# Patient Record
Sex: Male | Born: 1960 | Hispanic: No | Marital: Married | Smoking: Former smoker
Health system: Southern US, Community
[De-identification: ages and names within clinical notes are randomized; demographics above are authoritative.]

## PROBLEM LIST (undated history)

## (undated) DIAGNOSIS — T7840XA Allergy, unspecified, initial encounter: Secondary | ICD-10-CM

## (undated) DIAGNOSIS — N189 Chronic kidney disease, unspecified: Secondary | ICD-10-CM

## (undated) DIAGNOSIS — I219 Acute myocardial infarction, unspecified: Secondary | ICD-10-CM

## (undated) DIAGNOSIS — I429 Cardiomyopathy, unspecified: Secondary | ICD-10-CM

## (undated) DIAGNOSIS — E876 Hypokalemia: Secondary | ICD-10-CM

## (undated) DIAGNOSIS — I10 Essential (primary) hypertension: Secondary | ICD-10-CM

## (undated) DIAGNOSIS — E785 Hyperlipidemia, unspecified: Secondary | ICD-10-CM

## (undated) DIAGNOSIS — E119 Type 2 diabetes mellitus without complications: Secondary | ICD-10-CM

## (undated) HISTORY — DX: Acute myocardial infarction, unspecified: I21.9

## (undated) HISTORY — DX: Type 2 diabetes mellitus without complications: E11.9

## (undated) HISTORY — DX: Allergy, unspecified, initial encounter: T78.40XA

## (undated) HISTORY — DX: Essential (primary) hypertension: I10

## (undated) HISTORY — DX: Hypokalemia: E87.6

## (undated) HISTORY — DX: Hyperlipidemia, unspecified: E78.5

## (undated) HISTORY — DX: Chronic kidney disease, unspecified: N18.9

## (undated) HISTORY — DX: Cardiomyopathy, unspecified: I42.9

---

## 2008-04-22 ENCOUNTER — Emergency Department (HOSPITAL_COMMUNITY): Admission: EM | Admit: 2008-04-22 | Discharge: 2008-04-22 | Payer: Self-pay | Admitting: Emergency Medicine

## 2008-04-23 ENCOUNTER — Emergency Department (HOSPITAL_COMMUNITY): Admission: EM | Admit: 2008-04-23 | Discharge: 2008-04-23 | Payer: Self-pay | Admitting: Emergency Medicine

## 2011-02-17 LAB — BASIC METABOLIC PANEL
BUN: 16 mg/dL (ref 6–23)
Calcium: 9.1 mg/dL (ref 8.4–10.5)
Creatinine, Ser: 1.41 mg/dL (ref 0.4–1.5)
GFR calc non Af Amer: 54 mL/min — ABNORMAL LOW (ref >60)
Glucose, Bld: 147 mg/dL — ABNORMAL HIGH (ref 70–99)
Potassium: 4.2 mEq/L (ref 3.5–5.1)

## 2011-02-17 LAB — URINALYSIS, ROUTINE W REFLEX MICROSCOPIC
Bilirubin Urine: NEGATIVE
Ketones, ur: NEGATIVE mg/dL
Leukocytes, UA: NEGATIVE
Nitrite: NEGATIVE
Nitrite: NEGATIVE
Protein, ur: NEGATIVE mg/dL
Specific Gravity, Urine: 1.011 (ref 1.005–1.030)
Urobilinogen, UA: 0.2 mg/dL (ref 0.0–1.0)
pH: 7 (ref 5.0–8.0)
pH: 7 (ref 5.0–8.0)

## 2011-02-17 LAB — CBC
HCT: 47.8 % (ref 39.0–52.0)
HCT: 49.8 % (ref 39.0–52.0)
Hemoglobin: 16.2 g/dL (ref 13.0–17.0)
Platelets: 197 10*3/uL (ref 150–400)
Platelets: 212 10*3/uL (ref 150–400)
RDW: 14.2 % (ref 11.5–15.5)
RDW: 14.3 % (ref 11.5–15.5)
WBC: 9.6 10*3/uL (ref 4.0–10.5)

## 2011-02-17 LAB — DIFFERENTIAL
Basophils Absolute: 0 10*3/uL (ref 0.0–0.1)
Basophils Absolute: 0.1 10*3/uL (ref 0.0–0.1)
Basophils Relative: 1 % (ref 0–1)
Eosinophils Absolute: 0.1 10*3/uL (ref 0.0–0.7)
Eosinophils Relative: 0 % (ref 0–5)
Eosinophils Relative: 1 % (ref 0–5)
Lymphocytes Relative: 7 % — ABNORMAL LOW (ref 12–46)
Monocytes Absolute: 0.4 10*3/uL (ref 0.1–1.0)
Monocytes Relative: 4 % (ref 3–12)
Neutrophils Relative %: 88 % — ABNORMAL HIGH (ref 43–77)

## 2011-02-17 LAB — COMPREHENSIVE METABOLIC PANEL
ALT: 64 U/L — ABNORMAL HIGH (ref 0–53)
AST: 41 U/L — ABNORMAL HIGH (ref 0–37)
Albumin: 3.9 g/dL (ref 3.5–5.2)
Alkaline Phosphatase: 77 U/L (ref 39–117)
Chloride: 109 mEq/L (ref 96–112)
Potassium: 3.7 mEq/L (ref 3.5–5.1)
Sodium: 141 mEq/L (ref 135–145)
Total Bilirubin: 1 mg/dL (ref 0.3–1.2)
Total Protein: 7.5 g/dL (ref 6.0–8.3)

## 2011-02-17 LAB — URINE MICROSCOPIC-ADD ON

## 2011-10-02 ENCOUNTER — Ambulatory Visit (INDEPENDENT_AMBULATORY_CARE_PROVIDER_SITE_OTHER): Payer: 59 | Admitting: Family Medicine

## 2011-10-02 VITALS — BP 155/100 | HR 68 | Temp 97.9°F | Resp 16 | Ht 63.0 in | Wt 190.0 lb

## 2011-10-02 DIAGNOSIS — I10 Essential (primary) hypertension: Secondary | ICD-10-CM

## 2011-10-02 MED ORDER — LISINOPRIL-HYDROCHLOROTHIAZIDE 10-12.5 MG PO TABS: 1.0000 | ORAL_TABLET | Freq: Every day | ORAL | Status: DC

## 2011-10-02 NOTE — Progress Notes (Signed)
  Subjective:    Patient ID: Nathan Buckley, male    DOB: 13-Apr-1961, 51 y.o.   MRN: [id number]  HPI 51 yo male here with high blood pressure, headache, and dizziness. Never seen here before. 2 days ago had headache and dizziness.  Feels better now.  Decided to take BP when at [employer] yesterday because still felt a little "off".  Says someone there told him his BP was too high and he should see someone but can't say what it was (language barrier).  Hasn't been to a doctor in a couple years.  No chest pain or shortness of breath.    Review of Systems Negative except as per HPI     Objective:   Physical Exam  Constitutional: He appears well-developed and well-nourished.  Cardiovascular: Normal rate, regular rhythm, normal heart sounds and intact distal pulses.   No murmur heard. Pulmonary/Chest: Effort normal and breath sounds normal.  Neurological: He is alert.  Skin: Skin is warm and dry.   EKG: NSR.  Normal St-Twaves. Normal intervals.        Assessment & Plan:  HTN - BMET pending Start lisinopril- HCT 10-12.5.  F/U in 1 week.

## 2011-10-03 LAB — BASIC METABOLIC PANEL
BUN: 17 mg/dL (ref 6–23)
Creat: 1.09 mg/dL (ref 0.50–1.35)
Potassium: 4.4 mEq/L (ref 3.5–5.3)

## 2012-05-15 HISTORY — PX: COLONOSCOPY: SHX174

## 2012-10-10 ENCOUNTER — Ambulatory Visit: Payer: PRIVATE HEALTH INSURANCE

## 2012-10-10 ENCOUNTER — Ambulatory Visit (INDEPENDENT_AMBULATORY_CARE_PROVIDER_SITE_OTHER): Payer: PRIVATE HEALTH INSURANCE | Admitting: Internal Medicine

## 2012-10-10 VITALS — BP 124/76 | HR 60 | Temp 98.0°F | Resp 16 | Ht 63.0 in | Wt 189.0 lb

## 2012-10-10 DIAGNOSIS — R079 Chest pain, unspecified: Secondary | ICD-10-CM

## 2012-10-10 DIAGNOSIS — R0602 Shortness of breath: Secondary | ICD-10-CM

## 2012-10-10 DIAGNOSIS — J209 Acute bronchitis, unspecified: Secondary | ICD-10-CM

## 2012-10-10 MED ORDER — HYDROCODONE-ACETAMINOPHEN 7.5-325 MG/15ML PO SOLN: 5.0000 mL | Freq: Four times a day (QID) | ORAL | Status: DC | PRN

## 2012-10-10 MED ORDER — AZITHROMYCIN 500 MG PO TABS: 500.0000 mg | ORAL_TABLET | Freq: Every day | ORAL | Status: DC

## 2012-10-10 NOTE — Progress Notes (Signed)
  Subjective:    Patient ID: Nathan Buckley, male    DOB: [birth year], 52 y.o.   MRN: [id number]  HPI 3 week hx of chest pain, hurts to breath and this causes him to feel sob.No cough or hemoptysis. No fever. Pain has been assoc with eating certain foods. Does not smoke. Has stopped taking his lisinopril. No regular MD. Overweight.  Further hx reveals had fever and bad cough with bloody sputum 3 weeks ago! Language confusion.   Review of Systems     Objective:   Physical Exam  Vitals reviewed. Constitutional: He is oriented to person, place, and time. He appears well-developed and well-nourished. No distress.  HENT:  Nose: Nose normal.  Eyes: EOM are normal.  Neck: Normal range of motion. Neck supple.  Cardiovascular: Normal rate, regular rhythm and normal heart sounds.   Pulmonary/Chest: Effort normal.  Neurological: He is alert and oriented to person, place, and time.  Skin: Skin is warm and dry.  Psychiatric: His speech is normal and behavior is normal. Judgment and thought content normal. His mood appears anxious. Cognition and memory are normal.    EKG normal [facility] reading (PRIMARY) by  Dr Perrin Maltese possible round density/mass posterior on lateral./bronchitic changes.       Assessment & Plan:  Chest Pain/Cause unclear/Probable bronchitis Will tx and F/up Language barrier

## 2012-10-10 NOTE — Patient Instructions (Signed)

## 2012-10-15 ENCOUNTER — Ambulatory Visit (INDEPENDENT_AMBULATORY_CARE_PROVIDER_SITE_OTHER): Payer: PRIVATE HEALTH INSURANCE | Admitting: Family Medicine

## 2012-10-15 VITALS — BP 152/100 | HR 65 | Temp 98.5°F | Resp 16 | Ht 63.0 in | Wt 189.0 lb

## 2012-10-15 DIAGNOSIS — I10 Essential (primary) hypertension: Secondary | ICD-10-CM

## 2012-10-15 DIAGNOSIS — Z79899 Other long term (current) drug therapy: Secondary | ICD-10-CM

## 2012-10-15 DIAGNOSIS — E669 Obesity, unspecified: Secondary | ICD-10-CM

## 2012-10-15 DIAGNOSIS — K219 Gastro-esophageal reflux disease without esophagitis: Secondary | ICD-10-CM

## 2012-10-15 LAB — LIPID PANEL
HDL: 30 mg/dL — ABNORMAL LOW (ref >39)
LDL Cholesterol: 73 mg/dL (ref 0–99)

## 2012-10-15 LAB — COMPREHENSIVE METABOLIC PANEL
ALT: 36 U/L (ref 0–53)
AST: 42 U/L — ABNORMAL HIGH (ref 0–37)
Albumin: 4.3 g/dL (ref 3.5–5.2)
Alkaline Phosphatase: 88 U/L (ref 39–117)
CO2: 27 mEq/L (ref 19–32)
Calcium: 9 mg/dL (ref 8.4–10.5)
Creat: 1.19 mg/dL (ref 0.50–1.35)
Glucose, Bld: 96 mg/dL (ref 70–99)

## 2012-10-15 LAB — POCT CBC
HCT, POC: 47.4 % (ref 43.5–53.7)
Hemoglobin: 14.9 g/dL (ref 14.1–18.1)
Lymph, poc: 2.2 (ref 0.6–3.4)
MCH, POC: 24.7 pg — AB (ref 27–31.2)
MCHC: 31.4 g/dL — AB (ref 31.8–35.4)
WBC: 8.5 10*3/uL (ref 4.6–10.2)

## 2012-10-15 LAB — TSH: TSH: 1.321 u[IU]/mL (ref 0.350–4.500)

## 2012-10-15 MED ORDER — OMEPRAZOLE 40 MG PO CPDR: 40.0000 mg | DELAYED_RELEASE_CAPSULE | Freq: Every day | ORAL | Status: DC

## 2012-10-15 MED ORDER — LISINOPRIL-HYDROCHLOROTHIAZIDE 10-12.5 MG PO TABS: 1.0000 | ORAL_TABLET | Freq: Every day | ORAL | Status: DC

## 2012-10-15 NOTE — Patient Instructions (Addendum)
DASH Diet  The DASH diet stands for "Dietary Approaches to Stop Hypertension." It is a healthy eating plan that has been shown to reduce high blood pressure (hypertension) in as little as 14 days, while also possibly providing other significant health benefits. These other health benefits include reducing the risk of breast cancer after menopause and reducing the risk of type 2 diabetes, heart disease, colon cancer, and stroke. Health benefits also include weight loss and slowing kidney failure in patients with chronic kidney disease.   DIET GUIDELINES  · Limit salt (sodium). Your diet should contain less than 1500 mg of sodium daily.  · Limit refined or processed carbohydrates. Your diet should include mostly whole grains. Desserts and added sugars should be used sparingly.  · Include small amounts of heart-healthy fats. These types of fats include nuts, oils, and tub margarine. Limit saturated and trans fats. These fats have been shown to be harmful in the body.  CHOOSING FOODS   The following food groups are based on a 2000 calorie diet. See your Registered Dietitian for individual calorie needs.  Grains and Grain Products (6 to 8 servings daily)  · Eat More Often: Whole-wheat bread, brown rice, whole-grain or wheat pasta, quinoa, popcorn without added fat or salt (air popped).  · Eat Less Often: White bread, white pasta, white rice, cornbread.  Vegetables (4 to 5 servings daily)  · Eat More Often: Fresh, frozen, and canned vegetables. Vegetables may be raw, steamed, roasted, or grilled with a minimal amount of fat.  · Eat Less Often/Avoid: Creamed or fried vegetables. Vegetables in a cheese sauce.  Fruit (4 to 5 servings daily)  · Eat More Often: All fresh, canned (in natural juice), or frozen fruits. Dried fruits without added sugar. One hundred percent fruit juice (½ cup [237 mL] daily).  · Eat Less Often: Dried fruits with added sugar. Canned fruit in light or heavy syrup.  Lean Meats, Fish, and Poultry (2  servings or less daily. One serving is 3 to 4 oz [85-114 g]).  · Eat More Often: Ninety percent or leaner ground beef, tenderloin, sirloin. Round cuts of beef, chicken breast, turkey breast. All fish. Grill, bake, or broil your meat. Nothing should be fried.  · Eat Less Often/Avoid: Fatty cuts of meat, turkey, or chicken leg, thigh, or wing. Fried cuts of meat or fish.  Dairy (2 to 3 servings)  · Eat More Often: Low-fat or fat-free milk, low-fat plain or light yogurt, reduced-fat or part-skim cheese.  · Eat Less Often/Avoid: Milk (whole, 2%). Whole milk yogurt. Full-fat cheeses.  Nuts, Seeds, and Legumes (4 to 5 servings per week)  · Eat More Often: All without added salt.  · Eat Less Often/Avoid: Salted nuts and seeds, canned beans with added salt.  Fats and Sweets (limited)  · Eat More Often: Vegetable oils, tub margarines without trans fats, sugar-free gelatin. Mayonnaise and salad dressings.  · Eat Less Often/Avoid: Coconut oils, palm oils, butter, stick margarine, cream, half and half, cookies, candy, pie.  FOR MORE INFORMATION  The Dash Diet Eating Plan: www.dashdiet.org  Document Released: 04/20/2011 Document Revised: 07/24/2011 Document Reviewed: 04/20/2011  ExitCare® Patient Information ©2014 [employer].

## 2012-10-15 NOTE — Progress Notes (Signed)
  Subjective:    Patient ID: Nathan Buckley, male    DOB: Feb 05, 1961, 52 y.o.   MRN: [id number] Chief Complaint  Patient presents with  . Follow-up    chest pain now feels better  . Medication Refill    blood pressure medicine    HPI  Chest pain is still a present a little but much better - did hurt last night night before he went to work - he put menthol on chest and back which helped and is currently chest pain free.    Ran out of BP med last yr - checks BP at [employer] - always high about 190s!!  No prob with prior med.  No further cough or hemoptysis (which was very mild prior.)  Past Medical History  Diagnosis Date  . Allergy   . Hypertension    No current outpatient prescriptions on file prior to visit.   No current facility-administered medications on file prior to visit.   No Known Allergies   Review of Systems    BP 152/100  Pulse 65  Temp(Src) 98.5 F (36.9 C) (Oral)  Resp 16  Ht 5\' 3"  (1.6 m)  Wt 189 lb (85.73 kg)  BMI 33.49 kg/m2  SpO2 96% Objective:   Physical Exam        Assessment & Plan:  Hypertension - Plan: POCT CBC, Comprehensive metabolic panel, Lipid panel, TSH  GERD (gastroesophageal reflux disease) - Plan: H. pylori antibody, IgG  Encounter for long-term (current) use of other medications  Obesity (BMI 30.0-34.9)  Meds ordered this encounter  Medications  . lisinopril-hydrochlorothiazide (PRINZIDE,ZESTORETIC) 10-12.5 MG per tablet    Sig: Take 1 tablet by mouth daily.    Dispense:  90 tablet    Refill:  1  . omeprazole (PRILOSEC) 40 MG capsule    Sig: Take 1 capsule (40 mg total) by mouth daily.    Dispense:  30 capsule    Refill:  3

## 2012-10-16 LAB — H. PYLORI ANTIBODY, IGG: H Pylori IgG: 0.42 {ISR}

## 2012-10-31 ENCOUNTER — Telehealth: Payer: Self-pay

## 2012-10-31 NOTE — Telephone Encounter (Signed)
Did they get letter?

## 2012-10-31 NOTE — Telephone Encounter (Signed)
Pt's son called in regards to the unable to reach letter we sent for his labs. Pt's son notified and copy of labs sent to pt

## 2012-10-31 NOTE — Telephone Encounter (Signed)
Pt's son is calling in regards to pt's labs. Best# (Son/[place]) [phone]

## 2012-12-26 ENCOUNTER — Ambulatory Visit (INDEPENDENT_AMBULATORY_CARE_PROVIDER_SITE_OTHER): Payer: PRIVATE HEALTH INSURANCE | Admitting: Family Medicine

## 2012-12-26 VITALS — BP 116/76 | HR 65 | Temp 98.7°F | Resp 18 | Ht 63.0 in | Wt 184.0 lb

## 2012-12-26 DIAGNOSIS — R1013 Epigastric pain: Secondary | ICD-10-CM

## 2012-12-26 DIAGNOSIS — R079 Chest pain, unspecified: Secondary | ICD-10-CM

## 2012-12-26 DIAGNOSIS — R0602 Shortness of breath: Secondary | ICD-10-CM

## 2012-12-26 LAB — COMPREHENSIVE METABOLIC PANEL
ALT: 24 U/L (ref 0–53)
AST: 19 U/L (ref 0–37)
Albumin: 4.1 g/dL (ref 3.5–5.2)
Alkaline Phosphatase: 65 U/L (ref 39–117)
BUN: 20 mg/dL (ref 6–23)
Calcium: 9.4 mg/dL (ref 8.4–10.5)
Chloride: 105 mEq/L (ref 96–112)
Creat: 1.11 mg/dL (ref 0.50–1.35)
Potassium: 4.2 mEq/L (ref 3.5–5.3)

## 2012-12-26 LAB — POCT CBC
Granulocyte percent: 44.7 %G (ref 37–80)
MID (cbc): 0.6 (ref 0–0.9)
POC Granulocyte: 2.7 (ref 2–6.9)
POC LYMPH PERCENT: 45.7 %L (ref 10–50)
Platelet Count, POC: 203 10*3/uL (ref 142–424)
RBC: 5.88 M/uL (ref 4.69–6.13)
RDW, POC: 15.6 %

## 2012-12-26 MED ORDER — SUCRALFATE 1 GM/10ML PO SUSP: 1.0000 g | Freq: Three times a day (TID) | ORAL | Status: DC

## 2012-12-26 NOTE — Patient Instructions (Addendum)
1.  You have an appointment with [facility] on Friday, 12/27/12 at 2:20.  Address:  [address] ([employer] [address]).  [phone] 2.  Present to the emergency room for recurrent chest pain. 3.  Start Aspirin 81mg  one tablet daily.

## 2012-12-26 NOTE — Progress Notes (Signed)
Subjective:    Patient ID: Nathan Buckley, male    DOB: 1960-08-09, 52 y.o.   MRN: [id number]  HPI This 52 y.o. male presents for evaluation of epigastric pain, shortness of breath.  Onset of symptoms two months ago with worsening.   Patient reports worsening epigastric pain. He has pain that starts in his upper abdomen, radiates up towards chest and throat and sometimes into back. Pain is squeezing and is somewhat relieved with pressing down on upper abdomen with his hands for 15 minutes. Pain occurs when he doesn't eat as well as when he eats a normal or large meal, or with heavy lifting.  Will get sweat with severe epigastric pain.  Has lost weight in past few months.  Duration of pain usually lasts 15 minutes. Prescribed Omeprazole 40mg  daily two months ago for symptoms but symptoms have worsened.  Reports that he also gets epigastric pain that radiates into substernal region and into back with jogging/exercising for 5-10 minutes every day after work.  Epigastric and chest pain also causes shortness of breath while jogging.  Resting improves pain.    Review of Systems  Constitutional: Positive for diaphoresis and fatigue. Negative for fever and appetite change.  Respiratory: Positive for shortness of breath. Negative for cough, chest tightness and wheezing.   Cardiovascular: Negative for leg swelling.  Gastrointestinal: Positive for abdominal pain. Negative for vomiting, diarrhea, constipation, blood in stool and abdominal distention.  Genitourinary: Negative for hematuria and difficulty urinating.  Musculoskeletal: Negative for myalgias, joint swelling and arthralgias.  Allergic/Immunologic: Negative for environmental allergies and food allergies.  Neurological: Negative for dizziness, light-headedness and headaches.  Psychiatric/Behavioral: The patient is not nervous/anxious.    Past Medical History  Diagnosis Date  . Allergy   . Hypertension    History reviewed. No  pertinent past surgical history. No Known Allergies Current Outpatient Prescriptions on File Prior to Visit  Medication Sig Dispense Refill  . lisinopril-hydrochlorothiazide (PRINZIDE,ZESTORETIC) 10-12.5 MG per tablet Take 1 tablet by mouth daily.  90 tablet  1  . omeprazole (PRILOSEC) 40 MG capsule Take 1 capsule (40 mg total) by mouth daily.  30 capsule  3   No current facility-administered medications on file prior to visit.   History   Social History  . Marital Status: Married    Spouse Name: N/A    Number of Children: N/A  . Years of Education: N/A   Occupational History  . Not on file.   Social History Main Topics  . Smoking status: Former Games developer  . Smokeless tobacco: Not on file  . Alcohol Use: Not on file  . Drug Use: Not on file  . Sexual Activity: Not on file   Other Topics Concern  . Not on file   Social History Narrative  . No narrative on file   History reviewed. No pertinent family history.    Objective:   Physical Exam  Constitutional: He is oriented to person, place, and time. He appears well-developed and well-nourished. No distress.  HENT:  Head: Normocephalic and atraumatic.  Mouth/Throat: Oropharynx is clear and moist.  Eyes: Conjunctivae and EOM are normal. Pupils are equal, round, and reactive to light.  Neck: Normal range of motion. Neck supple. No JVD present. No thyromegaly present.  Cardiovascular: Normal rate, regular rhythm, normal heart sounds and intact distal pulses.  Exam reveals no gallop and no friction rub.   No murmur heard. Pulmonary/Chest: Effort normal and breath sounds normal. No respiratory distress. He has no wheezes. He has  no rales.  Abdominal: Soft. Bowel sounds are normal. He exhibits no distension and no mass. There is no hepatosplenomegaly. There is tenderness in the epigastric area. There is no rebound, no guarding, no CVA tenderness, no tenderness at McBurney's point and negative Murphy's sign. No hernia.  Bowel sounds  slightly decreased throughout. Slight, diffuse tenderness with deep palpation.  Musculoskeletal: He exhibits no edema.  Lymphadenopathy:    He has no cervical adenopathy.  Neurological: He is alert and oriented to person, place, and time. No cranial nerve deficit. He exhibits normal muscle tone. Coordination normal.  Skin: Skin is warm and dry. He is not diaphoretic.  Psychiatric: He has a normal mood and affect. His behavior is normal.   Results for orders placed in visit on 12/26/12  POCT CBC      Result Value Range   WBC 6.1  4.6 - 10.2 K/uL   Lymph, poc 2.8  0.6 - 3.4   POC LYMPH PERCENT 45.7  10 - 50 %L   MID (cbc) 0.6  0 - 0.9   POC MID % 9.6  0 - 12 %M   POC Granulocyte 2.7  2 - 6.9   Granulocyte percent 44.7  37 - 80 %G   RBC 5.88  4.69 - 6.13 M/uL   Hemoglobin 14.5  14.1 - 18.1 g/dL   HCT, POC 40.9  81.1 - 53.7 %   MCV 78.4 (*) 80 - 97 fL   MCH, POC 24.7 (*) 27 - 31.2 pg   MCHC 31.5 (*) 31.8 - 35.4 g/dL   RDW, POC 91.4     Platelet Count, POC 203  142 - 424 K/uL   MPV 9.6  0 - 99.8 fL   EKG:  NSR: new T wave inversion in inferior leads and lateral leads.    Assessment & Plan:  Abdominal pain, epigastric - Plan: Comprehensive metabolic panel, CBC with Differential  Shortness of breath  Chest pain - Plan: EKG 12-Lead  1- Chest pain:  New.  Exertional component and post-prandial component.  New EKG changes concerning for ischemia; advised to avoid exercise; start ASA 81mg  daily; also advised to present to ED if pain recurs.  Patient has appointment tomorrow, 8/15 at 2:20 with [facility].   2- Epigastric pain- worsening; exertional component and post-prandial compoent; increased Omeprazole 40 mg to BID; add Carafate qac and qhs. Refer to GI for EGD; H. Pylori negative at last visit. 3.  SOB:  New. Associated with chest pain and epigastric pain. CXR negative on 10/10/12.  Normal pulse oximetry and respiratory exam.  Concern for anginal equivalent.  Refer  to cardiology.  Meds ordered this encounter  Medications  . sucralfate (CARAFATE) 1 GM/10ML suspension    Sig: Take 10 mL (1 g total) by mouth 4 (four) times daily -  with meals and at bedtime.    Dispense:  420 mL    Refill:  0

## 2012-12-27 ENCOUNTER — Encounter (HOSPITAL_COMMUNITY): Payer: Self-pay | Admitting: Emergency Medicine

## 2012-12-27 ENCOUNTER — Emergency Department (HOSPITAL_COMMUNITY): Payer: PRIVATE HEALTH INSURANCE

## 2012-12-27 ENCOUNTER — Encounter: Payer: Self-pay | Admitting: Physician Assistant

## 2012-12-27 ENCOUNTER — Ambulatory Visit (INDEPENDENT_AMBULATORY_CARE_PROVIDER_SITE_OTHER): Payer: PRIVATE HEALTH INSURANCE | Admitting: Physician Assistant

## 2012-12-27 ENCOUNTER — Inpatient Hospital Stay (HOSPITAL_COMMUNITY): Payer: PRIVATE HEALTH INSURANCE

## 2012-12-27 ENCOUNTER — Inpatient Hospital Stay (HOSPITAL_COMMUNITY)
Admission: EM | Admit: 2012-12-27 | Discharge: 2013-01-05 | DRG: 234 | Disposition: A | Discharge status: Home / Self Care | Payer: PRIVATE HEALTH INSURANCE | Attending: Cardiothoracic Surgery | Admitting: Cardiothoracic Surgery

## 2012-12-27 ENCOUNTER — Encounter: Payer: Self-pay | Admitting: Internal Medicine

## 2012-12-27 ENCOUNTER — Inpatient Hospital Stay (HOSPITAL_COMMUNITY)
Admission: AD | Admit: 2012-12-27 | Payer: PRIVATE HEALTH INSURANCE | Source: Ambulatory Visit | Admitting: Internal Medicine

## 2012-12-27 VITALS — BP 120/78 | HR 69 | Ht 63.0 in | Wt 184.0 lb

## 2012-12-27 DIAGNOSIS — I2510 Atherosclerotic heart disease of native coronary artery without angina pectoris: Principal | ICD-10-CM | POA: Diagnosis present

## 2012-12-27 DIAGNOSIS — R9431 Abnormal electrocardiogram [ECG] [EKG]: Secondary | ICD-10-CM | POA: Diagnosis present

## 2012-12-27 DIAGNOSIS — I200 Unstable angina: Secondary | ICD-10-CM | POA: Diagnosis present

## 2012-12-27 DIAGNOSIS — I10 Essential (primary) hypertension: Secondary | ICD-10-CM | POA: Insufficient documentation

## 2012-12-27 DIAGNOSIS — M549 Dorsalgia, unspecified: Secondary | ICD-10-CM | POA: Diagnosis present

## 2012-12-27 DIAGNOSIS — D62 Acute posthemorrhagic anemia: Secondary | ICD-10-CM | POA: Diagnosis not present

## 2012-12-27 DIAGNOSIS — Z951 Presence of aortocoronary bypass graft: Secondary | ICD-10-CM

## 2012-12-27 DIAGNOSIS — I059 Rheumatic mitral valve disease, unspecified: Secondary | ICD-10-CM | POA: Diagnosis present

## 2012-12-27 DIAGNOSIS — I340 Nonrheumatic mitral (valve) insufficiency: Secondary | ICD-10-CM | POA: Diagnosis present

## 2012-12-27 DIAGNOSIS — I341 Nonrheumatic mitral (valve) prolapse: Secondary | ICD-10-CM

## 2012-12-27 DIAGNOSIS — Z87891 Personal history of nicotine dependence: Secondary | ICD-10-CM

## 2012-12-27 LAB — COMPREHENSIVE METABOLIC PANEL
AST: 22 U/L (ref 0–37)
Albumin: 3.4 g/dL — ABNORMAL LOW (ref 3.5–5.2)
BUN: 19 mg/dL (ref 6–23)
Calcium: 9.5 mg/dL (ref 8.4–10.5)
Creatinine, Ser: 1.16 mg/dL (ref 0.50–1.35)
Total Bilirubin: 0.4 mg/dL (ref 0.3–1.2)

## 2012-12-27 LAB — CBC
MCH: 24.5 pg — ABNORMAL LOW (ref 26.0–34.0)
MCHC: 33.1 g/dL (ref 30.0–36.0)
Platelets: 187 10*3/uL (ref 150–400)
RDW: 14.3 % (ref 11.5–15.5)

## 2012-12-27 LAB — PROTIME-INR
INR: 0.92 (ref 0.00–1.49)
Prothrombin Time: 12.2 seconds (ref 11.6–15.2)

## 2012-12-27 LAB — POCT I-STAT TROPONIN I: Troponin i, poc: 0.03 ng/mL (ref 0.00–0.08)

## 2012-12-27 MED ORDER — METOPROLOL TARTRATE 12.5 MG HALF TABLET
12.5000 mg | ORAL_TABLET | Freq: Two times a day (BID) | ORAL | Status: DC
  Administered 2012-12-27 – 2012-12-30 (×6): 12.5 mg via ORAL
  Filled 2012-12-27 (×7): qty 1

## 2012-12-27 MED ORDER — ATORVASTATIN CALCIUM 10 MG PO TABS
10.0000 mg | ORAL_TABLET | Freq: Every day | ORAL | Status: DC
  Administered 2012-12-27 – 2013-01-04 (×7): 10 mg via ORAL
  Filled 2012-12-27 (×10): qty 1

## 2012-12-27 MED ORDER — ACETAMINOPHEN 325 MG PO TABS
650.0000 mg | ORAL_TABLET | ORAL | Status: DC | PRN
  Administered 2012-12-30 (×2): 650 mg via ORAL
  Filled 2012-12-27 (×2): qty 2

## 2012-12-27 MED ORDER — ASPIRIN EC 81 MG PO TBEC
81.0000 mg | DELAYED_RELEASE_TABLET | Freq: Every day | ORAL | Status: DC
  Administered 2012-12-28 – 2012-12-31 (×4): 81 mg via ORAL
  Filled 2012-12-27 (×5): qty 1

## 2012-12-27 MED ORDER — HEPARIN BOLUS VIA INFUSION
4000.0000 [IU] | Freq: Once | INTRAVENOUS | Status: AC
  Administered 2012-12-28: 4000 [IU] via INTRAVENOUS
  Filled 2012-12-27: qty 4000

## 2012-12-27 MED ORDER — NITROGLYCERIN IN D5W 200-5 MCG/ML-% IV SOLN
2.0000 ug/min | INTRAVENOUS | Status: DC
  Administered 2012-12-28: 5 ug/min via INTRAVENOUS
  Administered 2012-12-30: 20 ug/min via INTRAVENOUS
  Filled 2012-12-27 (×3): qty 250

## 2012-12-27 MED ORDER — ASPIRIN 300 MG RE SUPP
300.0000 mg | RECTAL | Status: AC
  Filled 2012-12-27: qty 1

## 2012-12-27 MED ORDER — ONDANSETRON HCL 4 MG/2ML IJ SOLN: 4.0000 mg | Freq: Four times a day (QID) | INTRAMUSCULAR | Status: DC | PRN

## 2012-12-27 MED ORDER — IOHEXOL 350 MG/ML SOLN
100.0000 mL | Freq: Once | INTRAVENOUS | Status: AC | PRN
  Administered 2012-12-27: 100 mL via INTRAVENOUS

## 2012-12-27 MED ORDER — HEPARIN (PORCINE) IN NACL 100-0.45 UNIT/ML-% IJ SOLN
900.0000 [IU]/h | INTRAMUSCULAR | Status: DC
  Administered 2012-12-28 (×2): 1200 [IU]/h via INTRAVENOUS
  Administered 2012-12-29: 1050 [IU]/h via INTRAVENOUS
  Filled 2012-12-27 (×4): qty 250

## 2012-12-27 MED ORDER — NITROGLYCERIN 0.4 MG SL SUBL: 0.4000 mg | SUBLINGUAL_TABLET | SUBLINGUAL | Status: DC | PRN

## 2012-12-27 MED ORDER — ASPIRIN 81 MG PO CHEW: 324.0000 mg | CHEWABLE_TABLET | ORAL | Status: AC

## 2012-12-27 NOTE — ED Notes (Signed)
Report has been call to floor. Pt going to CT before being taken to room upstairs.

## 2012-12-27 NOTE — H&P (Signed)
Nathan Buckley is an 52 y.o. male.   Chief Complaint:  Chest pain/back pain HPI: Patient is a 52 year old male originally from [place]. Past medical history includes allergies and hypertension. He has been experiencing chest/upper abdominal pain with SON and radiation to his back for approximately 2 months. He states it is worse with exertion both with ambulation and when lifting heavy objects. It eases off about 10-15 minutes of rest. At times it is worse after he eats.  He was seen by his primary care doctor yesterday who did an EKG and saw some T wave abnormalities.  He did start him on omeprazole as his pain as a GI component to it.  His EKG today shows a dramatic Q waves in the inferior leads along with T-wave inversion which is also in V3 through 6.  Past Medical History  Diagnosis Date  . Allergy   . Hypertension     No past surgical history on file.  ffamily history: Both parents are still living in their 71s no history of coronary disease.  Social History:  reports that he has quit smoking. He does not have any smokeless tobacco history on file. His alcohol and drug histories are not on file.  Allergies: No Known Allergies   (Not in a hospital admission)  Results for orders placed in visit on 12/26/12 (from the past 48 hour(s))  COMPREHENSIVE METABOLIC PANEL     Status: None   Collection Time    12/26/12  1:55 PM      Result Value Range   Sodium 137  135 - 145 mEq/L   Potassium 4.2  3.5 - 5.3 mEq/L   Chloride 105  96 - 112 mEq/L   CO2 24  19 - 32 mEq/L   Glucose, Bld 75  70 - 99 mg/dL   BUN 20  6 - 23 mg/dL   Creat 1.61  0.96 - 0.45 mg/dL   Total Bilirubin 0.5  0.3 - 1.2 mg/dL   Alkaline Phosphatase 65  39 - 117 U/L   AST 19  0 - 37 U/L   ALT 24  0 - 53 U/L   Total Protein 7.6  6.0 - 8.3 g/dL   Albumin 4.1  3.5 - 5.2 g/dL   Calcium 9.4  8.4 - 40.9 mg/dL  POCT CBC     Status: Abnormal   Collection Time    12/26/12  1:58 PM      Result Value Range   WBC  6.1  4.6 - 10.2 K/uL   Lymph, poc 2.8  0.6 - 3.4   POC LYMPH PERCENT 45.7  10 - 50 %L   MID (cbc) 0.6  0 - 0.9   POC MID % 9.6  0 - 12 %M   POC Granulocyte 2.7  2 - 6.9   Granulocyte percent 44.7  37 - 80 %G   RBC 5.88  4.69 - 6.13 M/uL   Hemoglobin 14.5  14.1 - 18.1 g/dL   HCT, POC 81.1  91.4 - 53.7 %   MCV 78.4 (*) 80 - 97 fL   MCH, POC 24.7 (*) 27 - 31.2 pg   MCHC 31.5 (*) 31.8 - 35.4 g/dL   RDW, POC 78.2     Platelet Count, POC 203  142 - 424 K/uL   MPV 9.6  0 - 99.8 fL   No results found.  Review of Systems  Constitutional: Negative for fever and diaphoresis.  HENT: Negative for congestion and sore throat.  Respiratory: Positive for shortness of breath. Negative for cough.   Cardiovascular: Positive for chest pain. Negative for orthopnea, leg swelling and PND.  Gastrointestinal: Negative for nausea, vomiting, blood in stool and melena.  Genitourinary: Negative for hematuria.  Musculoskeletal: Positive for back pain.  Neurological: Negative for dizziness.  All other systems reviewed and are negative.    Blood pressure 120/78, pulse 69, height 5\' 3"  (1.6 m), weight 184 lb (83.462 kg). Physical Exam  Vitals reviewed. Constitutional: He is oriented to person, place, and time. He appears well-developed and well-nourished. No distress.  HENT:  Head: Normocephalic and atraumatic.  Eyes: EOM are normal. Pupils are equal, round, and reactive to light. No scleral icterus.  Neck: Normal range of motion. Neck supple. No JVD present.  Cardiovascular: Normal rate, regular rhythm, S1 normal and S2 normal.   Murmur heard.  Systolic murmur is present with a grade of 1/6  Pulses:      Radial pulses are 2+ on the right side, and 2+ on the left side.       Posterior tibial pulses are 2+ on the right side, and 2+ on the left side.  No Carotid bruits. + epigastric/abd bruit  Respiratory: Effort normal and breath sounds normal. He has no wheezes. He has no rales.  GI: Soft. Bowel  sounds are normal. There is tenderness (epigastric).  Musculoskeletal: He exhibits no edema.  Lymphadenopathy:    He has no cervical adenopathy.  Neurological: He is alert and oriented to person, place, and time. He exhibits normal muscle tone.  Skin: Skin is warm and dry.  Psychiatric: He has a normal mood and affect.     Assessment/Plan 1. Unstable angina 2.Hypertension  Patient be admitted to step down unit at [facility]. He was taken for a CT angiogram of the aorta both chest and abdomen to rule out dissection. That is negative he'll be started on IV heparin and nitroglycerin.  He'll need left heart catheterization on Monday. Labs: Troponin x3 every 6 hours, compressive metabolic panel, CBC, lipid panel, PT/INR. We'll Lopressor 12.5 mg twice daily. We'll hold lisinopril. Start Lipitor.   Orval Dortch 12/27/2012, 4:10 PM

## 2012-12-27 NOTE — Progress Notes (Deleted)
    Date:  12/27/2012   ID:  Nathan Buckley, DOB [birth year], MRN [id number]  PCP:  No PCP Per Patient  Primary Cardiologist:  ***     History of Present Illness: Nathan Buckley is a 52 y.o. male ***  The patient currently denies nausea, vomiting, fever, chest pain, shortness of breath, orthopnea, dizziness, PND, cough, congestion, abdominal pain, hematochezia, melena, lower extremity edema, claudication.  Wt Readings from Last 3 Encounters:  12/27/12 184 lb (83.462 kg)  12/26/12 184 lb (83.462 kg)  10/15/12 189 lb (85.73 kg)     Past Medical History  Diagnosis Date  . Allergy   . Hypertension     Current Outpatient Prescriptions  Medication Sig Dispense Refill  . aspirin 81 MG tablet Take 81 mg by mouth daily.      Marland Kitchen lisinopril-hydrochlorothiazide (PRINZIDE,ZESTORETIC) 10-12.5 MG per tablet Take 1 tablet by mouth daily.  90 tablet  1  . omeprazole (PRILOSEC) 40 MG capsule Take 1 capsule (40 mg total) by mouth daily.  30 capsule  3  . sucralfate (CARAFATE) 1 GM/10ML suspension Take 10 mL (1 g total) by mouth 4 (four) times daily -  with meals and at bedtime.  420 mL  0   No current facility-administered medications for this visit.    Allergies:   No Known Allergies  Social History:  The patient  reports that he has quit smoking. He does not have any smokeless tobacco history on file.   Family history:  No family history on file.  ROS:  Please see the history of present illness.  All other systems reviewed and negative.   PHYSICAL EXAM: VS:  BP 120/78  Pulse 69  Ht 5\' 3"  (1.6 m)  Wt 184 lb (83.462 kg)  BMI 32.6 kg/m2 Well nourished, well developed, in no acute distress HEENT: Pupils are equal round react to light accommodation extraocular movements are intact.  Neck: no JVDNo cervical lymphadenopathy. Cardiac: Regular rate and rhythm without murmurs rubs or gallops. Lungs:  clear to auscultation bilaterally, no wheezing, rhonchi or  rales Abd: soft, nontender, positive bowel sounds all quadrants, no hepatosplenomegaly Ext: no lower extremity edema.  2+ radial and dorsalis pedis pulses. Skin: warm and dry Neuro:  Grossly normal  EKG:    ASSESSMENT AND PLAN:  Problem List Items Addressed This Visit   None

## 2012-12-27 NOTE — ED Notes (Signed)
Pt states CP is worse with ambulation. States "what I usually do is place pressure under my rib cage with my hands for five minutes, which relieves it. But today, it just isn't working." Reports pain at 2/10.

## 2012-12-27 NOTE — Progress Notes (Signed)
ANTICOAGULATION CONSULT NOTE - Initial Consult  Pharmacy Consult for heparin Indication: chest pain/ACS  No Known Allergies  Patient Measurements:    Dosing Weight: 83.5 kg   Vital Signs: Temp: 98.5 F (36.9 C) (08/15 1614) Temp src: Oral (08/15 1614) BP: 126/71 mmHg (08/15 2000) Pulse Rate: 68 (08/15 2000)  Labs:  Recent Labs  12/26/12 1355 12/26/12 1358 12/27/12 1647 12/27/12 1720  HGB  --  14.5 14.1  --   HCT  --  46.1 42.6  --   PLT  --   --  187  --   LABPROT  --   --   --  12.2  INR  --   --   --  0.92  CREATININE 1.11  --  1.16  --     The CrCl is unknown because both a height and weight (above a minimum accepted value) are required for this calculation.   Medical History: Past Medical History  Diagnosis Date  . Allergy   . Hypertension     Medications:  Prescriptions prior to admission  Medication Sig Dispense Refill  . aspirin 81 MG tablet Take 81 mg by mouth daily.      Marland Kitchen lisinopril-hydrochlorothiazide (PRINZIDE,ZESTORETIC) 10-12.5 MG per tablet Take 1 tablet by mouth daily.  90 tablet  1  . omeprazole (PRILOSEC) 40 MG capsule Take 1 capsule (40 mg total) by mouth daily.  30 capsule  3  . sucralfate (CARAFATE) 1 GM/10ML suspension Take 10 mL (1 g total) by mouth 4 (four) times daily -  with meals and at bedtime.  420 mL  0    Assessment: 52 yo with chest pain. CT rules out dissection. Risk factor of htn. Plan for cath on Monday. H/H/plt and baseline inr normal. Heparin for ACS protocol. Goal of Therapy:  Heparin level 0.3-0.7 units/ml Monitor platelets by anticoagulation protocol: Yes   Plan:  Heparin 4000units x1 then 1200 units/hr. 6 hour HL  Daily HL and cbc starting 8/17   Janice Coffin 12/27/2012,11:10 PM

## 2012-12-27 NOTE — Progress Notes (Signed)
Nathan Buckley is an 52 y.o. male.   Chief Complaint:  Chest pain/back pain HPI: Patient is a 52-year-old male originally from [place]. Past medical history includes allergies and hypertension. He has been experiencing chest/upper abdominal pain with SON and radiation to his back for approximately 2 months. He states it is worse with exertion both with ambulation and when lifting heavy objects. It eases off about 10-15 minutes of rest. At times it is worse after he eats.  He was seen by his primary care doctor yesterday who did an EKG and saw some T wave abnormalities.  He did start him on omeprazole as his pain as a GI component to it.  His EKG today shows a dramatic Q waves in the inferior leads along with T-wave inversion which is also in V3 through 6.  Past Medical History  Diagnosis Date  . Allergy   . Hypertension     No past surgical history on file.  ffamily history: Both parents are still living in their 80s no history of coronary disease.  Social History:  reports that he has quit smoking. He does not have any smokeless tobacco history on file. His alcohol and drug histories are not on file.  Allergies: No Known Allergies   (Not in a hospital admission)  Results for orders placed in visit on 12/26/12 (from the past 48 hour(s))  COMPREHENSIVE METABOLIC PANEL     Status: None   Collection Time    12/26/12  1:55 PM      Result Value Range   Sodium 137  135 - 145 mEq/L   Potassium 4.2  3.5 - 5.3 mEq/L   Chloride 105  96 - 112 mEq/L   CO2 24  19 - 32 mEq/L   Glucose, Bld 75  70 - 99 mg/dL   BUN 20  6 - 23 mg/dL   Creat 1.11  0.50 - 1.35 mg/dL   Total Bilirubin 0.5  0.3 - 1.2 mg/dL   Alkaline Phosphatase 65  39 - 117 U/L   AST 19  0 - 37 U/L   ALT 24  0 - 53 U/L   Total Protein 7.6  6.0 - 8.3 g/dL   Albumin 4.1  3.5 - 5.2 g/dL   Calcium 9.4  8.4 - 10.5 mg/dL  POCT CBC     Status: Abnormal   Collection Time    12/26/12  1:58 PM      Result Value Range   WBC  6.1  4.6 - 10.2 K/uL   Lymph, poc 2.8  0.6 - 3.4   POC LYMPH PERCENT 45.7  10 - 50 %L   MID (cbc) 0.6  0 - 0.9   POC MID % 9.6  0 - 12 %M   POC Granulocyte 2.7  2 - 6.9   Granulocyte percent 44.7  37 - 80 %G   RBC 5.88  4.69 - 6.13 M/uL   Hemoglobin 14.5  14.1 - 18.1 g/dL   HCT, POC 46.1  43.5 - 53.7 %   MCV 78.4 (*) 80 - 97 fL   MCH, POC 24.7 (*) 27 - 31.2 pg   MCHC 31.5 (*) 31.8 - 35.4 g/dL   RDW, POC 15.6     Platelet Count, POC 203  142 - 424 K/uL   MPV 9.6  0 - 99.8 fL   No results found.  Review of Systems  Constitutional: Negative for fever and diaphoresis.  HENT: Negative for congestion and sore throat.     Respiratory: Positive for shortness of breath. Negative for cough.   Cardiovascular: Positive for chest pain. Negative for orthopnea, leg swelling and PND.  Gastrointestinal: Negative for nausea, vomiting, blood in stool and melena.  Genitourinary: Negative for hematuria.  Musculoskeletal: Positive for back pain.  Neurological: Negative for dizziness.  All other systems reviewed and are negative.    Blood pressure 120/78, pulse 69, height 5' 3" (1.6 m), weight 184 lb (83.462 kg). Physical Exam  Vitals reviewed. Constitutional: He is oriented to person, place, and time. He appears well-developed and well-nourished. No distress.  HENT:  Head: Normocephalic and atraumatic.  Eyes: EOM are normal. Pupils are equal, round, and reactive to light. No scleral icterus.  Neck: Normal range of motion. Neck supple. No JVD present.  Cardiovascular: Normal rate, regular rhythm, S1 normal and S2 normal.   Murmur heard.  Systolic murmur is present with a grade of 1/6  Pulses:      Radial pulses are 2+ on the right side, and 2+ on the left side.       Posterior tibial pulses are 2+ on the right side, and 2+ on the left side.  No Carotid bruits. + epigastric/abd bruit  Respiratory: Effort normal and breath sounds normal. He has no wheezes. He has no rales.  GI: Soft. Bowel  sounds are normal. There is tenderness (epigastric).  Musculoskeletal: He exhibits no edema.  Lymphadenopathy:    He has no cervical adenopathy.  Neurological: He is alert and oriented to person, place, and time. He exhibits normal muscle tone.  Skin: Skin is warm and dry.  Psychiatric: He has a normal mood and affect.     Assessment/Plan 1. Unstable angina 2.Hypertension  Patient be admitted to step down unit at [facility]. He was taken for a CT angiogram of the aorta both chest and abdomen to rule out dissection. That is negative he'll be started on IV heparin and nitroglycerin.  He'll need left heart catheterization on Monday. Labs: Troponin x3 every 6 hours, compressive metabolic panel, CBC, lipid panel, PT/INR. We'll Lopressor 12.5 mg twice daily. We'll hold lisinopril. Start Lipitor.   Branton Einstein 12/27/2012, 4:10 PM    

## 2012-12-27 NOTE — ED Provider Notes (Signed)
I received a call from Dr. Royann Shivers. He was seen and evaluated by [facility] cardiology. He is supposed to be direct admission to [facility] hospital. There is a misunderstanding of being put into the emergency room bed. Dr. Royann Shivers who has already performed a complete evaluation on him. I spoke directly to Dr. Royann Shivers.  I placed the admission orders on the patient as requested by Dr. Royann Shivers and I did not examine the patient. I did not take history from the patient and evaluate the patient  Claudean Kinds, MD 12/27/12 [phone]

## 2012-12-27 NOTE — ED Notes (Signed)
Per EMS: Pt from [facility] with c/o intermittent epigastric CP with radiation to back x 2 months. Denies SOB, N/V. Given 325 aspirin. CP 3/10, reduced to 2/10 with 1 nitro. 140/84. 63 SR. 95% 2L.

## 2012-12-28 ENCOUNTER — Encounter (HOSPITAL_COMMUNITY): Payer: Self-pay | Admitting: *Deleted

## 2012-12-28 DIAGNOSIS — I059 Rheumatic mitral valve disease, unspecified: Secondary | ICD-10-CM

## 2012-12-28 DIAGNOSIS — R9431 Abnormal electrocardiogram [ECG] [EKG]: Secondary | ICD-10-CM | POA: Diagnosis present

## 2012-12-28 DIAGNOSIS — I2510 Atherosclerotic heart disease of native coronary artery without angina pectoris: Secondary | ICD-10-CM | POA: Diagnosis present

## 2012-12-28 DIAGNOSIS — I200 Unstable angina: Secondary | ICD-10-CM

## 2012-12-28 LAB — LIPID PANEL
Cholesterol: 176 mg/dL (ref 0–200)
LDL Cholesterol: 104 mg/dL — ABNORMAL HIGH (ref 0–99)
Total CHOL/HDL Ratio: 5 RATIO
VLDL: 37 mg/dL (ref 0–40)

## 2012-12-28 LAB — COMPREHENSIVE METABOLIC PANEL
Albumin: 3.2 g/dL — ABNORMAL LOW (ref 3.5–5.2)
Alkaline Phosphatase: 59 U/L (ref 39–117)
BUN: 17 mg/dL (ref 6–23)
Calcium: 8.9 mg/dL (ref 8.4–10.5)
Creatinine, Ser: 1.1 mg/dL (ref 0.50–1.35)
GFR calc Af Amer: 87 mL/min — ABNORMAL LOW (ref >90)
Glucose, Bld: 112 mg/dL — ABNORMAL HIGH (ref 70–99)
Potassium: 3.5 mEq/L (ref 3.5–5.1)
Total Protein: 6.6 g/dL (ref 6.0–8.3)

## 2012-12-28 LAB — BASIC METABOLIC PANEL
BUN: 16 mg/dL (ref 6–23)
CO2: 28 mEq/L (ref 19–32)
Calcium: 9.2 mg/dL (ref 8.4–10.5)
Creatinine, Ser: 1.23 mg/dL (ref 0.50–1.35)
GFR calc non Af Amer: 65 mL/min — ABNORMAL LOW (ref >90)
Glucose, Bld: 121 mg/dL — ABNORMAL HIGH (ref 70–99)

## 2012-12-28 LAB — PROTIME-INR: INR: 0.97 (ref 0.00–1.49)

## 2012-12-28 LAB — CBC
HCT: 41.5 % (ref 39.0–52.0)
Platelets: 188 10*3/uL (ref 150–400)
RDW: 14.1 % (ref 11.5–15.5)
WBC: 6.2 10*3/uL (ref 4.0–10.5)

## 2012-12-28 LAB — GLUCOSE, CAPILLARY: Glucose-Capillary: 110 mg/dL — ABNORMAL HIGH (ref 70–99)

## 2012-12-28 LAB — HEPARIN LEVEL (UNFRACTIONATED): Heparin Unfractionated: 0.48 IU/mL (ref 0.30–0.70)

## 2012-12-28 LAB — HEMOGLOBIN A1C
Hgb A1c MFr Bld: 6.3 % — ABNORMAL HIGH (ref <5.7)
Mean Plasma Glucose: 134 mg/dL — ABNORMAL HIGH (ref <117)

## 2012-12-28 MED ORDER — PANTOPRAZOLE SODIUM 40 MG PO TBEC
40.0000 mg | DELAYED_RELEASE_TABLET | Freq: Every day | ORAL | Status: DC
  Administered 2012-12-28 – 2013-01-01 (×5): 40 mg via ORAL
  Filled 2012-12-28 (×5): qty 1

## 2012-12-28 NOTE — Progress Notes (Signed)
Subjective:  Still some chest discomfort  Objective:  Vital Signs in the last 24 hours: Temp:  [97.4 F (36.3 C)-98.5 F (36.9 C)] 97.4 F (36.3 C) (08/16 0403) Pulse Rate:  [51-73] 56 (08/16 0403) Resp:  [12-23] 18 (08/16 0403) BP: (120-138)/(71-80) 132/72 mmHg (08/16 0403) SpO2:  [96 %-100 %] 98 % (08/16 0403) Weight:  [184 lb (83.462 kg)-185 lb 3.2 oz (84.006 kg)] 185 lb 3.2 oz (84.006 kg) (08/16 0403)  Intake/Output from previous day:  Intake/Output Summary (Last 24 hours) at 12/28/12 0931 Last data filed at 12/28/12 7829  Gross per 24 hour  Intake    360 ml  Output      0 ml  Net    360 ml    Physical Exam: General appearance: alert, cooperative and no distress Neck: no adenopathy, no carotid bruit, no JVD, supple, symmetrical, trachea midline and thyroid not enlarged, symmetric, no tenderness/mass/nodules Lungs: clear to auscultation bilaterally Heart: regular rate and rhythm, S1, S2 normal, systolic murmur: holosystolic 3/6, musical at apex, radiates to axilla and mid systolic click present Abdomen: soft, non-tender; bowel sounds normal; no masses,  no organomegaly Extremities: extremities normal, atraumatic, no cyanosis or edema Pulses: 2+ and symmetric Skin: Skin color, texture, turgor normal. No rashes or lesions Neurologic: Alert and oriented X 3, normal strength and tone. Normal symmetric reflexes. Normal coordination and gait   Rate: 56  Rhythm: sinus bradycardia  Lab Results:  Recent Labs  12/26/12 1358 12/27/12 1647  WBC 6.1 6.3  HGB 14.5 14.1  PLT  --  187    Recent Labs  12/27/12 2230 12/28/12 0500  NA 141 142  K 3.5 4.0  CL 107 107  CO2 25 28  GLUCOSE 112* 121*  BUN 17 16  CREATININE 1.10 1.23    Recent Labs  12/27/12 2340 12/28/12 0500  TROPONINI <0.30 0.31*   Hepatic Function Panel  Recent Labs  12/27/12 2230  PROT 6.6  ALBUMIN 3.2*  AST 19  ALT 23  ALKPHOS 59  BILITOT 0.3    Recent Labs  12/28/12 0500  CHOL 176     Recent Labs  12/27/12 2230  INR 0.97    Imaging: Imaging results have been reviewed  Cardiac Studies:  Assessment/Plan:   Principal Problem:   Unstable angina Active Problems:   Abnormal EKG   Essential hypertension   Coronary artery disease- noted on CT    PLAN: CTA negative for dissection. He did have CAD noted on CT. His EKG changes have improved. With dynamic EKG changes and CAD noted on CT, high suspicion for USA/CAD. Probably will need cath Monday. Add PPI, continue IV Heparin, NTG, ASA, beta blocker.  Corine Shelter PA-C Beeper [phone] 12/28/2012, 9:31 AM   I have seen and examined the patient along with Corine Shelter PA-C.  I have reviewed the chart, notes and new data.  I agree with PA's note.  Key new complaints: asymptomatic other than mild occipital headache Key examination changes: no arrhythmia or signs of CHF; loud holosystolic murmur at apex Key new findings / data: Normal LVEF and wall motion on echo (I do not see evidence of inferoapical infarct reported by CT). Note is made of moderate anterior mitral valve prolapse and moderate MR  PLAN: Coronary angio on Monday (unless symptoms/ECG dictate urgent evaluation over the weekend). Agree with current pharmacological therapy.  Thurmon Fair, MD, [facility] [phone] 12/28/2012, 11:42 AM

## 2012-12-28 NOTE — H&P (Addendum)
Nathan Buckley is an 52 y.o. male.  Chief Complaint: Chest pain/back pain  HPI:  Patient is a 52 year old male originally from [place]. Past medical history includes allergies and hypertension. He has been experiencing chest/upper abdominal pain with SON and radiation to his back for approximately 2 months. He states it is worse with exertion both with ambulation and when lifting heavy objects. It eases off about 10-15 minutes of rest. At times it is worse after he eats. He was seen by his primary care doctor yesterday who did an EKG and saw some T wave abnormalities. He did start him on omeprazole as his pain as a GI component to it.  His EKG today shows a dramatic Q waves in the inferior leads along with T-wave inversion which is also in V3 through 6.  Past Medical History   Diagnosis  Date   .  Allergy    .  Hypertension     No past surgery.  ffamily history: Both parents are still living in their 26s no history of coronary disease.  Social History: reports that he has quit smoking. He does not have any smokeless tobacco history on file. His alcohol and drug histories are not on file.  Allergies: No Known Allergies   (Not in a hospital admission)  Results for orders placed in visit on 12/26/12 (from the past 48 hour(s))   COMPREHENSIVE METABOLIC PANEL Status: None    Collection Time    12/26/12 1:55 PM   Result  Value  Range    Sodium  137  135 - 145 mEq/L    Potassium  4.2  3.5 - 5.3 mEq/L    Chloride  105  96 - 112 mEq/L    CO2  24  19 - 32 mEq/L    Glucose, Bld  75  70 - 99 mg/dL    BUN  20  6 - 23 mg/dL    Creat  9.60  4.54 - 1.35 mg/dL    Total Bilirubin  0.5  0.3 - 1.2 mg/dL    Alkaline Phosphatase  65  39 - 117 U/L    AST  19  0 - 37 U/L    ALT  24  0 - 53 U/L    Total Protein  7.6  6.0 - 8.3 g/dL    Albumin  4.1  3.5 - 5.2 g/dL    Calcium  9.4  8.4 - 10.5 mg/dL   POCT CBC Status: Abnormal    Collection Time    12/26/12 1:58 PM   Result  Value  Range    WBC   6.1  4.6 - 10.2 K/uL    Lymph, poc  2.8  0.6 - 3.4    POC LYMPH PERCENT  45.7  10 - 50 %L    MID (cbc)  0.6  0 - 0.9    POC MID %  9.6  0 - 12 %M    POC Granulocyte  2.7  2 - 6.9    Granulocyte percent  44.7  37 - 80 %G    RBC  5.88  4.69 - 6.13 M/uL    Hemoglobin  14.5  14.1 - 18.1 g/dL    HCT, POC  09.8  11.9 - 53.7 %    MCV  78.4 (*)  80 - 97 fL    MCH, POC  24.7 (*)  27 - 31.2 pg    MCHC  31.5 (*)  31.8 - 35.4 g/dL    RDW,  POC  15.6     Platelet Count, POC  203  142 - 424 K/uL    MPV  9.6  0 - 99.8 fL    No results found.  Review of Systems  Constitutional: Negative for fever and diaphoresis.  HENT: Negative for congestion and sore throat.  Respiratory: Positive for shortness of breath. Negative for cough.  Cardiovascular: Positive for chest pain. Negative for orthopnea, leg swelling and PND.  Gastrointestinal: Negative for nausea, vomiting, blood in stool and melena.  Genitourinary: Negative for hematuria.  Musculoskeletal: Positive for back pain.  Neurological: Negative for dizziness.  All other systems reviewed and are negative.   Blood pressure 120/78, pulse 69, height 5\' 3"  (1.6 m), weight 184 lb (83.462 kg).  Physical Exam  Vitals reviewed.  Constitutional: He is oriented to person, place, and time. He appears well-developed and well-nourished. No distress.  HENT:  Head: Normocephalic and atraumatic.  Eyes: EOM are normal. Pupils are equal, round, and reactive to light. No scleral icterus.  Neck: Normal range of motion. Neck supple. No JVD present.  Cardiovascular: Normal rate, regular rhythm, S1 normal and S2 normal.  Murmur heard.  Systolic murmur is present with a grade of 1/6  Pulses:  Radial pulses are 2+ on the right side, and 2+ on the left side.  Posterior tibial pulses are 2+ on the right side, and 2+ on the left side.  No Carotid bruits. + epigastric/abd bruit  Respiratory: Effort normal and breath sounds normal. He has no wheezes. He has no rales.   GI: Soft. Bowel sounds are normal. There is tenderness (epigastric).  Musculoskeletal: He exhibits no edema.  Lymphadenopathy:  He has no cervical adenopathy.  Neurological: He is alert and oriented to person, place, and time. He exhibits normal muscle tone.  Skin: Skin is warm and dry.  Psychiatric: He has a normal mood and affect.   Assessment/Plan  1. Unstable angina  2.Hypertension  Patient be admitted to step down unit at [facility]. He was taken for a CT angiogram of the aorta both chest and abdomen to rule out dissection. That is negative he'll be started on IV heparin and nitroglycerin. He'll need left heart catheterization on Monday. Labs: Troponin x3 every 6 hours, compressive metabolic panel, CBC, lipid panel, PT/INR. We'll Lopressor 12.5 mg twice daily. We'll hold lisinopril. Start Lipitor.  HAGER, BRYAN  12/27/2012, 4:10 PM  I have seen and examined the patient along with Wilburt Finlay, PA.  I have reviewed the chart, notes and new data.  I agree with PA's note.  Key new complaints: asymptomatic now Key examination changes: no clinical signs of CHF, no arrhythmia Key new findings / data: CTA of aorta is negative for dissection or aneurysm, coronary calcification is noted, as is a possible old inferoapical LV infarction. Especially noteworthy is a bulky calcium rich plaque in the distal left main coronary artery, with moderate proximal LAD and circumflex calcification as well.  PLAN: Coronary angio on Monday, sooner if he develops unstable ECG changes and worsening angina  Thurmon Fair, MD, [facility] [phone] 12/27/2012

## 2012-12-28 NOTE — Progress Notes (Signed)
  Echocardiogram 2D Echocardiogram has been performed.  [name] 12/28/2012, 9:37 AM

## 2012-12-28 NOTE — Progress Notes (Signed)
ANTICOAGULATION CONSULT NOTE - Follow Up Consult  Pharmacy Consult for Heparin Indication: chest pain/ACS  No Known Allergies  Patient Measurements: Height: 5\' 3"  (160 cm) Weight: 185 lb 3.2 oz (84.006 kg) IBW/kg (Calculated) : 56.9 Heparin Dosing Weight: 83.5 kg  Vital Signs: Temp: 97.4 F (36.3 C) (08/16 0403) Temp src: Oral (08/16 0403) BP: 130/90 mmHg (08/16 1027) Pulse Rate: 62 (08/16 1027)  Labs:  Recent Labs  12/26/12 1358 12/27/12 1647 12/27/12 1720 12/27/12 2230 12/27/12 2340 12/28/12 0500 12/28/12 0930  HGB 14.5 14.1  --   --   --   --   --   HCT 46.1 42.6  --   --   --   --   --   PLT  --  187  --   --   --   --   --   APTT  --   --   --  37  --   --   --   LABPROT  --   --  12.2 12.7  --   --   --   INR  --   --  0.92 0.97  --   --   --   HEPARINUNFRC  --   --   --   --   --   --  0.48  CREATININE  --  1.16  --  1.10  --  1.23  --   TROPONINI  --   --   --   --  <0.30 0.31*  --     Estimated Creatinine Clearance: 66.5 ml/min (by C-G formula based on Cr of 1.23).   Medications:  Prescriptions prior to admission  Medication Sig Dispense Refill  . aspirin 81 MG tablet Take 81 mg by mouth daily.      Marland Kitchen lisinopril-hydrochlorothiazide (PRINZIDE,ZESTORETIC) 10-12.5 MG per tablet Take 1 tablet by mouth daily.  90 tablet  1  . omeprazole (PRILOSEC) 40 MG capsule Take 1 capsule (40 mg total) by mouth daily.  30 capsule  3  . sucralfate (CARAFATE) 1 GM/10ML suspension Take 10 mL (1 g total) by mouth 4 (four) times daily -  with meals and at bedtime.  420 mL  0    Assessment: 52 yo with chest pain. CT rules out dissection. Risk factor of htn. Plan for cath on Monday. H/H/plt and baseline inr normal. Heparin for ACS protocol. Heparin level today 0.48. No reports of bleeding.  Goal of Therapy:  Heparin level 0.3-0.7 units/ml Monitor platelets by anticoagulation protocol: Yes   Plan:  - Continue heparin at 1200 units/hr - Daily HL and CBC   Margie Billet, PharmD Clinical Pharmacist - Resident Pager: [phone] Pharmacy: [phone] 12/28/2012 11:19 AM

## 2012-12-29 LAB — HEPARIN LEVEL (UNFRACTIONATED)
Heparin Unfractionated: 0.9 IU/mL — ABNORMAL HIGH (ref 0.30–0.70)
Heparin Unfractionated: 0.9 IU/mL — ABNORMAL HIGH (ref 0.30–0.70)

## 2012-12-29 LAB — GLUCOSE, CAPILLARY
Glucose-Capillary: 113 mg/dL — ABNORMAL HIGH (ref 70–99)
Glucose-Capillary: 115 mg/dL — ABNORMAL HIGH (ref 70–99)

## 2012-12-29 LAB — CBC
MCV: 74.7 fL — ABNORMAL LOW (ref 78.0–100.0)
Platelets: 173 10*3/uL (ref 150–400)
RBC: 5.58 MIL/uL (ref 4.22–5.81)
WBC: 7.8 10*3/uL (ref 4.0–10.5)

## 2012-12-29 MED ORDER — DIAZEPAM 5 MG PO TABS
5.0000 mg | ORAL_TABLET | ORAL | Status: AC
  Administered 2012-12-30: 5 mg via ORAL
  Filled 2012-12-29: qty 1

## 2012-12-29 MED ORDER — HEPARIN (PORCINE) IN NACL 100-0.45 UNIT/ML-% IJ SOLN
750.0000 [IU]/h | INTRAMUSCULAR | Status: DC
  Filled 2012-12-29: qty 250

## 2012-12-29 MED ORDER — SODIUM CHLORIDE 0.9 % IV SOLN
1.0000 mL/kg/h | INTRAVENOUS | Status: DC
  Administered 2012-12-29 – 2012-12-30 (×2): 1 mL/kg/h via INTRAVENOUS

## 2012-12-29 NOTE — Progress Notes (Signed)
  ANTICOAGULATION CONSULT NOTE - Follow Up Consult  Pharmacy Consult for Heparin Indication: chest pain/ACS  No Known Allergies  Patient Measurements: Height: 5\' 3"  (160 cm) Weight: 194 lb 7.1 oz (88.2 kg) IBW/kg (Calculated) : 56.9 Heparin Dosing Weight: 84kg  Vital Signs: Temp: 98.2 F (36.8 C) (08/17 1300) Temp src: Oral (08/17 1300) BP: 126/81 mmHg (08/17 1300) Pulse Rate: 70 (08/17 1300)  Labs:  Recent Labs  12/27/12 1647 12/27/12 1720 12/27/12 2230 12/27/12 2340 12/28/12 0500 12/28/12 0930 12/28/12 1140 12/29/12 0520 12/29/12 1244  HGB 14.1  --   --   --   --   --  14.0 14.3  --   HCT 42.6  --   --   --   --   --  41.5 41.7  --   PLT 187  --   --   --   --   --  188 173  --   APTT  --   --  37  --   --   --   --   --   --   LABPROT  --  12.2 12.7  --   --   --   --   --  12.8  INR  --  0.92 0.97  --   --   --   --   --  0.98  HEPARINUNFRC  --   --   --   --   --  0.48  --  0.90* 0.90*  CREATININE 1.16  --  1.10  --  1.23  --   --   --   --   TROPONINI  --   --   --  <0.30 0.31*  --  <0.30  --   --     Estimated Creatinine Clearance: 68.2 ml/min (by C-G formula based on Cr of 1.23).   Medications:  Heparin 1050 units/hr  Assessment: 52 y/o M continues on heparin for ACS with plans for cath on Monday. Heparin level is again supra-therapeutic 0.9<0.9 after rate decrease. CBC good, renal function stable, no overt bleeding noted.   Goal of Therapy:  Heparin level 0.3-0.7 units/ml Monitor platelets by anticoagulation protocol: Yes   Plan: -Decrease heparin infusion to 900 units/hr -Check 6 hour HL at 2100 -Daily CBC/HL -Monitor for bleeding  Thank you for allowing me to take part in this patient's care,  Abran Duke, PharmD Clinical Pharmacist Phone: [phone] Pager: [phone] 12/29/2012 2:21 PM

## 2012-12-29 NOTE — Progress Notes (Signed)
  ANTICOAGULATION CONSULT NOTE - Follow Up Consult  Pharmacy Consult for Heparin Indication: chest pain/ACS  No Known Allergies  Patient Measurements: Height: 5\' 3"  (160 cm) Weight: 185 lb 3.2 oz (84.006 kg) IBW/kg (Calculated) : 56.9 Heparin Dosing Weight: 84kg  Vital Signs: Temp: 98.1 F (36.7 C) (08/17 0500) BP: 119/80 mmHg (08/17 0500) Pulse Rate: 60 (08/17 0500)  Labs:  Recent Labs  12/27/12 1647 12/27/12 1720 12/27/12 2230 12/27/12 2340 12/28/12 0500 12/28/12 0930 12/28/12 1140 12/29/12 0520  HGB 14.1  --   --   --   --   --  14.0 14.3  HCT 42.6  --   --   --   --   --  41.5 41.7  PLT 187  --   --   --   --   --  188 173  APTT  --   --  37  --   --   --   --   --   LABPROT  --  12.2 12.7  --   --   --   --   --   INR  --  0.92 0.97  --   --   --   --   --   HEPARINUNFRC  --   --   --   --   --  0.48  --  0.90*  CREATININE 1.16  --  1.10  --  1.23  --   --   --   TROPONINI  --   --   --  <0.30 0.31*  --  <0.30  --     Estimated Creatinine Clearance: 66.5 ml/min (by C-G formula based on Cr of 1.23).   Medications:  Heparin 1200 units/hr  Assessment: 51yom continues on heparin for ACS with plans for cath on Monday. Heparin level (0.9) is now supratherapeutic - will decrease rate and check 6 hour follow-up level. - H/H and Plts wnl - No significant bleeding reported  Goal of Therapy:  Heparin level 0.3-0.7 units/ml Monitor platelets by anticoagulation protocol: Yes   Plan:  1. Decrease heparin drip to 1050 units/hr (10.5 ml/hr) 2. Check heparin level 6 hours after rate decrease  Cleon Dew [phone] 12/29/2012,6:32 AM

## 2012-12-29 NOTE — Progress Notes (Signed)
Subjective:  No chest pain  Objective:  Vital Signs in the last 24 hours: Temp:  [97.6 F (36.4 C)-98.3 F (36.8 C)] 98.1 F (36.7 C) (08/17 0900) Pulse Rate:  [55-67] 67 (08/17 0900) Resp:  [16-20] 20 (08/17 0900) BP: (108-138)/(61-90) 132/84 mmHg (08/17 0900) SpO2:  [95 %-100 %] 98 % (08/17 0900)  Intake/Output from previous day:  Intake/Output Summary (Last 24 hours) at 12/29/12 0937 Last data filed at 12/29/12 0843  Gross per 24 hour  Intake    840 ml  Output      0 ml  Net    840 ml    Physical Exam: General appearance: alert, cooperative and no distress Lungs: clear to auscultation bilaterally Heart: regular rate and rhythm   Rate: 68  Rhythm: normal sinus rhythm  Lab Results:  Recent Labs  12/28/12 1140 12/29/12 0520  WBC 6.2 7.8  HGB 14.0 14.3  PLT 188 173    Recent Labs  12/27/12 2230 12/28/12 0500  NA 141 142  K 3.5 4.0  CL 107 107  CO2 25 28  GLUCOSE 112* 121*  BUN 17 16  CREATININE 1.10 1.23    Recent Labs  12/28/12 0500 12/28/12 1140  TROPONINI 0.31* <0.30   Hepatic Function Panel  Recent Labs  12/27/12 2230  PROT 6.6  ALBUMIN 3.2*  AST 19  ALT 23  ALKPHOS 59  BILITOT 0.3    Recent Labs  12/28/12 0500  CHOL 176    Recent Labs  12/27/12 2230  INR 0.97    Imaging: Imaging results have been reviewed  Cardiac Studies:  Assessment/Plan:   Principal Problem:   Unstable angina Active Problems:   Abnormal EKG   Essential hypertension   Coronary artery disease- noted on CT    PLAN: Cath in am.  Corine Shelter PA-C Beeper [phone] 12/29/2012, 9:37 AM   I have seen and examined the patient along with Corine Shelter PA-C.  I have reviewed the chart, notes and new data.  I agree with PA's note.  Key new complaints: no angina walking in room Key examination changes: no arrhythmia or signs of CHF; excellent femoral, pedal and radial pulses Key new findings / data: normal cardiac enzymes and renal  function  PLAN: Cath +/-  PCI stent tomorrow. This procedure has been fully reviewed with the patient and informed consent has been obtained.   Thurmon Fair, MD, [facility] [phone] 12/29/2012, 10:12 AM

## 2012-12-29 NOTE — Progress Notes (Signed)
  ANTICOAGULATION CONSULT NOTE   Pharmacy Consult for Heparin Indication: chest pain/ACS  No Known Allergies  Patient Measurements: Height: 5\' 3"  (160 cm) Weight: 194 lb 7.1 oz (88.2 kg) IBW/kg (Calculated) : 56.9 Heparin Dosing Weight: 84kg  Vital Signs: Temp: 98.2 F (36.8 C) (08/17 1300) Temp src: Oral (08/17 1300) BP: 126/81 mmHg (08/17 1300) Pulse Rate: 70 (08/17 1300)  Labs:  Recent Labs  12/27/12 1647 12/27/12 1720 12/27/12 2230 12/27/12 2340 12/28/12 0500  12/28/12 1140 12/29/12 0520 12/29/12 1244 12/29/12 2031  HGB 14.1  --   --   --   --   --  14.0 14.3  --   --   HCT 42.6  --   --   --   --   --  41.5 41.7  --   --   PLT 187  --   --   --   --   --  188 173  --   --   APTT  --   --  37  --   --   --   --   --   --   --   LABPROT  --  12.2 12.7  --   --   --   --   --  12.8  --   INR  --  0.92 0.97  --   --   --   --   --  0.98  --   HEPARINUNFRC  --   --   --   --   --   < >  --  0.90* 0.90* 0.69  CREATININE 1.16  --  1.10  --  1.23  --   --   --   --   --   TROPONINI  --   --   --  <0.30 0.31*  --  <0.30  --   --   --   < > = values in this interval not displayed.  Estimated Creatinine Clearance: 68.2 ml/min (by C-G formula based on Cr of 1.23).   Medications:  Heparin 1050 units/hr  Assessment: 52 y/o M continues on heparin for ACS with plans for cath on Monday. Heparin level was supra-therapeutic 0.9 this afternoon and now tonight level is within range but still at upper end of goal (0.69) after rate decrease. CBC good, renal function stable, no overt bleeding noted.   Goal of Therapy:  Heparin level 0.3-0.7 units/ml Monitor platelets by anticoagulation protocol: Yes   Plan: -Decrease heparin infusion to 750 units/hr -Check HL in am 8/18 -Daily CBC/HL -Monitor for bleeding  Thank you for allowing me to take part in this patient's care,  Sheppard Coil PharmD., BCPS Clinical Pharmacist Pager [phone] 12/29/2012 9:44 PM

## 2012-12-30 ENCOUNTER — Encounter (HOSPITAL_COMMUNITY)
Admission: EM | Disposition: A | Discharge status: Home / Self Care | Payer: Self-pay | Source: Home / Self Care | Attending: Cardiovascular Disease

## 2012-12-30 ENCOUNTER — Other Ambulatory Visit: Payer: Self-pay | Admitting: *Deleted

## 2012-12-30 DIAGNOSIS — I059 Rheumatic mitral valve disease, unspecified: Secondary | ICD-10-CM

## 2012-12-30 DIAGNOSIS — I340 Nonrheumatic mitral (valve) insufficiency: Secondary | ICD-10-CM | POA: Diagnosis present

## 2012-12-30 DIAGNOSIS — I2510 Atherosclerotic heart disease of native coronary artery without angina pectoris: Principal | ICD-10-CM

## 2012-12-30 DIAGNOSIS — Z01810 Encounter for preprocedural cardiovascular examination: Secondary | ICD-10-CM

## 2012-12-30 HISTORY — PX: LEFT HEART CATHETERIZATION WITH CORONARY ANGIOGRAM: SHX5451

## 2012-12-30 LAB — BASIC METABOLIC PANEL
BUN: 14 mg/dL (ref 6–23)
CO2: 25 mEq/L (ref 19–32)
Calcium: 8.4 mg/dL (ref 8.4–10.5)
Chloride: 104 mEq/L (ref 96–112)
Creatinine, Ser: 1.1 mg/dL (ref 0.50–1.35)
GFR calc Af Amer: 87 mL/min — ABNORMAL LOW (ref >90)
GFR calc non Af Amer: 75 mL/min — ABNORMAL LOW (ref >90)
Glucose, Bld: 102 mg/dL — ABNORMAL HIGH (ref 70–99)
Potassium: 4 mEq/L (ref 3.5–5.1)
Sodium: 137 mEq/L (ref 135–145)

## 2012-12-30 LAB — CBC
HCT: 37.8 % — ABNORMAL LOW (ref 39.0–52.0)
Hemoglobin: 12.9 g/dL — ABNORMAL LOW (ref 13.0–17.0)
MCV: 72.4 fL — ABNORMAL LOW (ref 78.0–100.0)
RBC: 5.22 MIL/uL (ref 4.22–5.81)
WBC: 8.6 10*3/uL (ref 4.0–10.5)

## 2012-12-30 LAB — GLUCOSE, CAPILLARY

## 2012-12-30 LAB — MRSA PCR SCREENING: MRSA by PCR: NEGATIVE

## 2012-12-30 LAB — HEPARIN LEVEL (UNFRACTIONATED): Heparin Unfractionated: 0.39 IU/mL (ref 0.30–0.70)

## 2012-12-30 SURGERY — LEFT HEART CATHETERIZATION WITH CORONARY ANGIOGRAM
Anesthesia: LOCAL

## 2012-12-30 MED ORDER — NITROGLYCERIN 0.2 MG/ML ON CALL CATH LAB
INTRAVENOUS | Status: AC
  Filled 2012-12-30: qty 1

## 2012-12-30 MED ORDER — SODIUM CHLORIDE 0.9 % IV SOLN: 1.0000 mL/kg/h | INTRAVENOUS | Status: AC

## 2012-12-30 MED ORDER — HEPARIN SODIUM (PORCINE) 1000 UNIT/ML IJ SOLN
INTRAMUSCULAR | Status: AC
  Filled 2012-12-30: qty 1

## 2012-12-30 MED ORDER — VERAPAMIL HCL 2.5 MG/ML IV SOLN
INTRAVENOUS | Status: AC
  Filled 2012-12-30: qty 2

## 2012-12-30 MED ORDER — SODIUM CHLORIDE 0.9 % IJ SOLN
3.0000 mL | Freq: Two times a day (BID) | INTRAMUSCULAR | Status: DC
  Administered 2012-12-30: 3 mL via INTRAVENOUS

## 2012-12-30 MED ORDER — METOPROLOL TARTRATE 25 MG PO TABS
25.0000 mg | ORAL_TABLET | Freq: Two times a day (BID) | ORAL | Status: DC
  Administered 2012-12-30: 25 mg via ORAL
  Filled 2012-12-30 (×3): qty 1

## 2012-12-30 MED ORDER — FENTANYL CITRATE 0.05 MG/ML IJ SOLN
INTRAMUSCULAR | Status: AC
  Filled 2012-12-30: qty 2

## 2012-12-30 MED ORDER — LISINOPRIL 5 MG PO TABS
5.0000 mg | ORAL_TABLET | Freq: Every day | ORAL | Status: DC
  Administered 2012-12-30 – 2012-12-31 (×2): 5 mg via ORAL
  Filled 2012-12-30 (×2): qty 1

## 2012-12-30 MED ORDER — LIDOCAINE HCL (PF) 1 % IJ SOLN
INTRAMUSCULAR | Status: AC
  Filled 2012-12-30: qty 30

## 2012-12-30 MED ORDER — MIDAZOLAM HCL 2 MG/2ML IJ SOLN
INTRAMUSCULAR | Status: AC
  Filled 2012-12-30: qty 2

## 2012-12-30 MED ORDER — HEPARIN (PORCINE) IN NACL 2-0.9 UNIT/ML-% IJ SOLN
INTRAMUSCULAR | Status: AC
  Filled 2012-12-30: qty 2000

## 2012-12-30 MED ORDER — HEPARIN (PORCINE) IN NACL 100-0.45 UNIT/ML-% IJ SOLN
750.0000 [IU]/h | INTRAMUSCULAR | Status: DC
  Administered 2012-12-31: 750 [IU]/h via INTRAVENOUS
  Filled 2012-12-30 (×2): qty 250

## 2012-12-30 MED ORDER — SODIUM CHLORIDE 0.9 % IJ SOLN: 3.0000 mL | INTRAMUSCULAR | Status: DC | PRN

## 2012-12-30 MED ORDER — SODIUM CHLORIDE 0.9 % IV SOLN
250.0000 mL | INTRAVENOUS | Status: DC | PRN
  Administered 2012-12-31: 10 mL/h via INTRAVENOUS

## 2012-12-30 NOTE — CV Procedure (Signed)
CARDIAC CATHETERIZATION REPORT  NAME:  Nathan Buckley   MRN: [id number] DOB:  [birth year]   ADMIT DATE: 12/27/2012 Procedure Date: 12/30/2012  INTERVENTIONAL CARDIOLOGIST: Marykay Lex, M.D., MS PRIMARY CARE PROVIDER: No PCP Per Patient PRIMARY CARDIOLOGIST: HILTY / CROITORU  PATIENT:  Nathan Buckley is a 52 y.o. [name] with PMH of HTN & smoking history who was seen by Mr. Nathan Finlay, PA @ [facility] in consultation for ~2 month history of chest/upper [place] pain with shortness of breath radiating to his back.  The pain was initially worse with exertion, and relieved by rest however he has now been having episodes of discomfort at rest.  His ECG noted inferior Q waves and significant T-wave inversion with ST depression in V3 to V6.  He was admitted on August 16 for planned cardiac catheterization.  Echocardiogram was performed it showed normal EF of 55-60% with no regional wall motion abnormalities.  There is grade 2 diastolic dysfunction with increased filling pressures.  Is also noted moderate holosystolic mitral valve prolapse with moderate centrally posteriorly directed mitral regurgitation. Over the weekend, despite being on intravenous nitroglycerin glycerin, the patient has continued to have episodes of chest discomfort at rest. He now presents for cardiac catheterization  PRE-OPERATIVE DIAGNOSIS:    Unstable Angina  PROCEDURES PERFORMED:    LEFT HEART CATHETERIZATION WITH CORONARY ANGIOGRAPHY  PROCEDURE:Consent:  Risks of procedure as well as the alternatives and risks of each were explained to the (patient/caregiver).  Consent for procedure obtained. Consent for signed by MD and patient with RN witness -- placed on chart.   PROCEDURE: The patient was brought to the 2nd Floor [facility] in the fasting state and prepped and draped in the usual sterile fashion for Right groin or radial access. A modified Allen's test with  plethysmography was performed, revealing excellent Ulnar artery collateral flow.  Sterile technique was used including antiseptics, cap, gloves, gown, hand hygiene, mask and sheet.  Skin prep: Chlorhexidine.  Time Out: Verified patient identification, verified procedure, site/side was marked, verified correct patient position, special equipment/implants available, medications/allergies/relevent history reviewed, required imaging and test results available.  Performed  Access: Right Radial Artery; 6 Fr (Terumo Glide Sheath-Slender) Sheath -- Seldinger technique (Angiocath Micropuncture Kit)  Radial Cocktail - 10 mL via sheath, IV Heparin 4000 units Diagnostic:  TIG 4.0 - was advanced initially over Versicore then long exchange safety J-wire with much difficulty but tortuous brachiocephalic branch and aortic arch into the ascending aorta.  The catheter was used to engage first the left then the right coronary artery for angiography.  And also across the aortic valve for measurement ventricular hemodynamics.  Left Coronary Artery Angiography: TIG 4.0  Right Coronary Artery Angiography: TIG 4 point  LV Hemodynamics: TIG 4.0, LV gram was not performed due to the patient's recent echocardiogram.  TR Band:  1150 Hours, 15 mL air; nonocclusive hemostasis assured using plethysmography  MEDICATIONS:  Anesthesia:  Local Lidocaine 2 ml  Sedation:  1 mg IV Versed, 25 mcg IV fentanyl ;   Premedication: 5 mg oral Valium  Omnipaque Contrast: 70 ml  Anticoagulation:  IV Heparin 4000 Units  Anti-Platelet Agent:  None  Hemodynamics:  Central Aortic / Mean Pressures: 98/68 mmHg; 82 mmHg  Left Ventricular Pressures / EDP: 98/7 mmHg; 16 mmHg  Left Ventriculography: Not performed  EF: 55-60 % via echocardiogram  Coronary Anatomy:  Left Main: Very large caliber vessel that trifurcates into the LAD, Ramus Intermedius, Circumflex; angiographically normal LAD: The vessel  begins as a very small  caliber diffusely diseased vessel roughly 70% stenosis from what the size should be; it gives off a very proximal diagonal/optional diagonal branch that is 100% occluded proximally and fills via left to left collaterals.  Beyond this, the LAD continues to give off a small caliber septal trunk and then essentially tapers to occlusion with faint distal flow that appears to be via collaterals.  Also noted distally is competitive flow, presumably from right to left collaterals.  D1: At least a moderate caliber vessel, and it appeared to be proximally occluded.  This fills via left to left collaterals, and covers a large portion of the anterolateral wall  D2: Small-caliber vessel that is occluded right at the occlusion site of the LAD, this also fills very faintly from collateral vessels.  (From right coronary angiography this appears to be filled via an RV marginal branch collateral) Left Circumflex: Large-caliber vessel that gives off a moderate size atrial branch, and actually appears to provide collateral flow to the right posterior lateral vessel; there are 2 small obtuse marginal branches before the vessel bifurcates in the AV groove into an OM 1 and small left posterolateral (LPL) system.  The LPL system bifurcates and is to small branches, most distal branch has a roughly 80% stenosis.  Lateral OM: Moderate caliber vessel with 2 tandem 90% lesions with diffuse abnormality in the intervening segment.  Beyond this the vessel is relatively free of significant disease it courses along the inferolateral wall.  Ramus intermedius: Moderate large-caliber vessel, it is relatively free of disease until roughly 2/3 the way down, where it is 100 % occluded at a small branch vessel   RCA: Large-caliber dominant vessel with diffuse mild proximal disease into an ectatic segment where it major, moderate caliber RV marginal branch takes off.  Beyond this section, there is a large ectatic, diffusely irregular  /potentially thrombotic segment with 95-99% (subtotal) occlusion.  However there is TIMI 3 flow distally beyond this lesion in a large caliber vessel that bifurcates into the right posterior lateral branch and right posterior descending artery.    RV marginal branch: This is actually a moderate caliber vessel that radiates distally with the anterior branch providing right to left collaterals to the proximal segment of the LAD/diagonal lesion.  It also provides right to right collaterals to the posterior lateral branch.  RPDA: Moderate caliber vessel with extensive septal perforator and distal vessel collaterals to the LAD, and fills the LAD almost to the proximal/mid occlusion site.  The vessel does have an ostial with 60-70% stenosis.  RPL Sysytem:The RPAV is a large caliber vessel that terminates as a very large right posterior lateral branch that covers a major portion of the infero/postero-lateral wall.  This vessel was also perfused from left to right collaterals.  After it gives off the AV nodal artery, there is a 80-90% stenosis that is distal to the left to right collateral insertion site.  PATIENT DISPOSITION:    The patient was transferred to the PACU holding area in a hemodynamicaly stable, chest pain free condition.  The patient tolerated the procedure well, and there were no complications.  EBL:   < 10 ml  The patient was stable before, during, and after the procedure.  POST-OPERATIVE DIAGNOSIS:    Severe multivessel CAD, the likely culprit lesion is the mid RCA subtotal occlusion of this very important vessel as it fills the LAD system is diagonal via collaterals.  There is distal disease in this severely  diseased RCA.  The entire LAD system including 2 diagonal branches are diffusely diseased and occluded in the late via collaterals, the distal ramus intermedius is occluded, and the lateral OM has severe proximal stenoses.  Well-preserved ejection fraction with mildly elevated at  LVEDP  Moderate MR with mitral valve prolapse by echocardiogram  PLAN OF CARE:  The patient clearly has surgical disease, CT surgery as the consulted.  Due to the significance and severity of the disease with his ongoing episodes of chest discomfort, he'll be transferred to the TCU for close monitoring.  Will restart IV heparin in addition to nitroglycerin.  Low threshold for starting Integrilin if pain recurs.  Will titrate beta blocker up to 25 mg twice a day, and restart ACE inhibitor at one half of his home dose.  Due to him having ongoing chest discomfort, with severe multivessel disease, he will not be stable to be discharged for staged CABG.  The patient's family and primary cardiologist were both notified.   Marykay Lex, M.D., M.S. [facility] [address] [place], [place]  [address]  [phone]  12/30/2012 12:21 PM

## 2012-12-30 NOTE — Progress Notes (Signed)
Pre-op Cardiac Surgery  Carotid Findings:  1-39% ICA stenosis.  Vertebral artery flow is antegrade.  Upper Extremity Right Left  Brachial Pressures    Radial Waveforms    Ulnar Waveforms    Palmar Arch (Allen's Test)     Findings:      Lower  Extremity Right Left  Dorsalis Pedis    Anterior Tibial    Posterior Tibial    Ankle/Brachial Indices      Findings:    

## 2012-12-30 NOTE — Consult Note (Signed)
[address]       [place] [address]             [phone]        [facility] Medical Record #[id number] Date of Birth: 1961-02-23  Referring:  Dr Herbie Baltimore Primary Care: No PCP Per Patient  Chief Complaint:    Chief Complaint  Patient presents with  . Chest Pain    History of Present Illness:  The patient is a 52 year old male from [place]  for approximately the last 2 months has been having episodes of exertional chest pain. The pain typically lasts from 10-15 minutes and is relieved with rest. The pain does radiate to his back. He has noted increasing frequency and severity of the episodes. He presented to his primary care physician on 11/26/2012. An EKG done at that time showed some T wave abnormalities. He was started on omeprazole as there was felt to be some GI component to his pain. He was referred to [facility] and vascular for cardiology evaluation and they felt as though he had unstable angina and was admitted for further evaluation and treatment. He is a former smoker from approximately age 73 having quit in 2000. These episodes did have a component of shortness of breath and he is also experiencing orthopnea. He denies PND or peripheral edema. On day of admission his EKG showed Q waves in the inferior leads. A CT scan was negative for dissection. He was placed on PPI, intravenous heparin, nitroglycerin, aspirin, and beta blocker. Cardiac catheterization was done today and the following results are noted:  NAME: [facility] MRN: [id number]  DOB: 1961/04/24 ADMIT DATE: 12/27/2012  Procedure Date: 12/30/2012  INTERVENTIONAL CARDIOLOGIST: Marykay Lex, M.D., MS  PRIMARY CARE PROVIDER: No PCP Per Patient  PRIMARY CARDIOLOGIST: HILTY / CROITORU  PATIENT: Nathan [facility] is a 52 y.o. [name] with PMH of HTN & smoking history who was seen by Mr. Nathan Finlay, PA @ [facility] in consultation for ~2 month  history of chest/upper O'Donnell pain with shortness of breath radiating to his back. The pain was initially worse with exertion, and relieved by rest however he has now been having episodes of discomfort at rest. His ECG noted inferior Q waves and significant T-wave inversion with ST depression in V3 to V6. He was admitted on August 16 for planned cardiac catheterization. Echocardiogram was performed it showed normal EF of 55-60% with no regional wall motion abnormalities. There is grade 2 diastolic dysfunction with increased filling pressures. Is also noted moderate holosystolic mitral valve prolapse with moderate centrally posteriorly directed mitral regurgitation.  Over the weekend, despite being on intravenous nitroglycerin glycerin, the patient has continued to have episodes of chest discomfort at rest.  He now presents for cardiac catheterization  PRE-OPERATIVE DIAGNOSIS:  Unstable Angina PROCEDURES PERFORMED:  LEFT HEART CATHETERIZATION WITH CORONARY ANGIOGRAPHY PROCEDURE:Consent: Risks of procedure as well as the alternatives and risks of each were explained to the (patient/caregiver). Consent for procedure obtained.  Consent for signed by MD and patient with RN witness -- placed on chart.  PROCEDURE: The patient was brought to the 2nd Floor [facility] in the fasting state and prepped and draped in the usual sterile fashion for Right groin or radial access. A modified Allen's test with plethysmography was performed, revealing excellent Ulnar artery collateral flow. Sterile technique was used including antiseptics, cap, gloves, gown, hand hygiene, mask and sheet. Skin prep: Chlorhexidine.  Time Out:  Verified patient identification, verified procedure, site/side was marked, verified correct patient position, special equipment/implants available, medications/allergies/relevent history reviewed, required imaging and test results available. Performed  Access: Right Radial  Artery; 6 Fr (Terumo Glide Sheath-Slender) Sheath -- Seldinger technique (Angiocath Micropuncture Kit)  Radial Cocktail - 10 mL via sheath, IV Heparin 4000 units Diagnostic: TIG 4.0 - was advanced initially over Versicore then long exchange safety J-wire with much difficulty but tortuous brachiocephalic branch and aortic arch into the ascending aorta. The catheter was used to engage first the left then the right coronary artery for angiography. And also across the aortic valve for measurement ventricular hemodynamics.  Left Coronary Artery Angiography: TIG 4.0  Right Coronary Artery Angiography: TIG 4 point  LV Hemodynamics: TIG 4.0, LV gram was not performed due to the patient's recent echocardiogram. TR Band: 1150 Hours, 15 mL air; nonocclusive hemostasis assured using plethysmography  MEDICATIONS:  Anesthesia: Local Lidocaine 2 ml Sedation: 1 mg IV Versed, 25 mcg IV fentanyl ;  Premedication: 5 mg oral Valium Omnipaque Contrast: 70 ml  Anticoagulation: IV Heparin 4000 Units  Anti-Platelet Agent: None Hemodynamics:  Central Aortic / Mean Pressures: 98/68 mmHg; 82 mmHg  Left Ventricular Pressures / EDP: 98/7 mmHg; 16 mmHg Left Ventriculography: Not performed  EF: 55-60 % via echocardiogram Coronary Anatomy:  Left Main: Very large caliber vessel that trifurcates into the LAD, Ramus Intermedius, Circumflex; angiographically normal LAD: The vessel begins as a very small caliber diffusely diseased vessel roughly 70% stenosis from what the size should be; it gives off a very proximal diagonal/optional diagonal branch that is 100% occluded proximally and fills via left to left collaterals. Beyond this, the LAD continues to give off a small caliber septal trunk and then essentially tapers to occlusion with faint distal flow that appears to be via collaterals. Also noted distally is competitive flow, presumably from right to left collaterals.  D1: At least a moderate caliber vessel, and it appeared to  be proximally occluded. This fills via left to left collaterals, and covers a large portion of the anterolateral wall  D2: Small-caliber vessel that is occluded right at the occlusion site of the LAD, this also fills very faintly from collateral vessels. (From right coronary angiography this appears to be filled via an RV marginal branch collateral) Left Circumflex: Large-caliber vessel that gives off a moderate size atrial branch, and actually appears to provide collateral flow to the right posterior lateral vessel; there are 2 small obtuse marginal branches before the vessel bifurcates in the AV groove into an OM 1 and small left posterolateral (LPL) system. The LPL system bifurcates and is to small branches, most distal branch has a roughly 80% stenosis.  Lateral OM: Moderate caliber vessel with 2 tandem 90% lesions with diffuse abnormality in the intervening segment. Beyond this the vessel is relatively free of significant disease it courses along the inferolateral wall. Ramus intermedius: Moderate large-caliber vessel, it is relatively free of disease until roughly 2/3 the way down, where it is 100 % occluded at a small branch vessel  RCA: Large-caliber dominant vessel with diffuse mild proximal disease into an ectatic segment where it major, moderate caliber RV marginal branch takes off. Beyond this section, there is a large ectatic, diffusely irregular /potentially thrombotic segment with 95-99% (subtotal) occlusion. However there is TIMI 3 flow distally beyond this lesion in a large caliber vessel that bifurcates into the right posterior lateral branch and right posterior descending artery.  RV marginal branch: This is actually a moderate  caliber vessel that radiates distally with the anterior branch providing right to left collaterals to the proximal segment of the LAD/diagonal lesion. It also provides right to right collaterals to the posterior lateral branch. RPDA: Moderate caliber vessel with  extensive septal perforator and distal vessel collaterals to the LAD, and fills the LAD almost to the proximal/mid occlusion site. The vessel does have an ostial with 60-70% stenosis.  RPL Sysytem:The RPAV is a large caliber vessel that terminates as a very large right posterior lateral branch that covers a major portion of the infero/postero-lateral wall. This vessel was also perfused from left to right collaterals. After it gives off the AV nodal artery, there is a 80-90% stenosis that is distal to the left to right collateral insertion site. PATIENT DISPOSITION:  The patient was transferred to the PACU holding area in a hemodynamicaly stable, chest pain free condition.  The patient tolerated the procedure well, and there were no complications. EBL: < 10 ml  The patient was stable before, during, and after the procedure. POST-OPERATIVE DIAGNOSIS:  Severe multivessel CAD, the likely culprit lesion is the mid RCA subtotal occlusion of this very important vessel as it fills the LAD system is diagonal via collaterals. There is distal disease in this severely diseased RCA. The entire LAD system including 2 diagonal branches are diffusely diseased and occluded in the late via collaterals, the distal ramus intermedius is occluded, and the lateral OM has severe proximal stenoses.  Well-preserved ejection fraction with mildly elevated at LVEDP  Moderate MR with mitral valve prolapse by echocardiogram PLAN OF CARE:  The patient clearly has surgical disease, CT surgery as the consulted.  Due to the significance and severity of the disease with his ongoing episodes of chest discomfort, he'll be transferred to the TCU for close monitoring. Will restart IV heparin in addition to nitroglycerin. Low threshold for starting Integrilin if pain recurs.  Will titrate beta blocker up to 25 mg twice a day, and restart ACE inhibitor at one half of his home dose.  Due to him having ongoing chest discomfort, with severe  multivessel disease, he will not be stable to be discharged for staged CABG. The patient's family and primary cardiologist were both notified.  Marykay Lex, M.D., M.S.  [facility]  [address]  [place], [place] [address]  [phone]  Cardiothoracic surgery was asked to see the patient in consultation. Current Activity/ Functional Status: Patient is independent with mobility/ambulation, transfers, ADL's, IADL's.   Zubrod Score: At the time of surgery this patient's most appropriate activity status/level should be described as: []  Normal activity, no symptoms [x]  Symptoms, fully ambulatory []  Symptoms, in bed less than or equal to 50% of the time []  Symptoms, in bed greater than 50% of the time but less than 100% []  Bedridden []  Moribund  Past Medical History  Diagnosis Date  . Allergy   . Hypertension     History reviewed. No pertinent past surgical history.  History  Smoking status  . Former Smoker  Smokeless tobacco  . Not on file    History  Alcohol Use: Not on file    History   Social History  . Marital Status: Married    Spouse Name: N/A    Number of Children: N/A  . Years of Education: N/A   Occupational History  . Not on file.   Social History Main Topics  . Smoking status: Former Games developer  . Smokeless tobacco: Not on file  .  Alcohol Use: Not on file  . Drug Use: Not on file  . Sexual Activity: Not on file   Other Topics Concern  . Not on file   Social History Narrative  . No narrative on file    No Known Allergies  Current Facility-Administered Medications  Medication Dose Route Frequency Provider Last Rate Last Dose  . 0.9 %  sodium chloride infusion  1 mL/kg/hr Intravenous Continuous Marykay Lex, MD 86.2 mL/hr at 12/30/12 1245 1 mL/kg/hr at 12/30/12 1245  . 0.9 %  sodium chloride infusion  250 mL Intravenous PRN Marykay Lex, MD      . acetaminophen (TYLENOL) tablet 650 mg  650 mg Oral  Q4H PRN Nathan Finlay, PA-C   650 mg at 12/30/12 0059  . aspirin EC tablet 81 mg  81 mg Oral Daily Nathan Finlay, PA-C   81 mg at 12/30/12 1017  . atorvastatin (LIPITOR) tablet 10 mg  10 mg Oral q1800 Nathan Finlay, PA-C   10 mg at 12/29/12 1705  . lisinopril (PRINIVIL,ZESTRIL) tablet 5 mg  5 mg Oral Daily Marykay Lex, MD      . metoprolol tartrate (LOPRESSOR) tablet 25 mg  25 mg Oral BID Marykay Lex, MD      . nitroGLYCERIN (NITROSTAT) SL tablet 0.4 mg  0.4 mg Sublingual Q5 Min x 3 PRN Nathan Finlay, PA-C      . nitroGLYCERIN 0.2 mg/mL in dextrose 5 % infusion  2-200 mcg/min Intravenous Titrated Quintella Reichert, MD 6 mL/hr at 12/30/12 0616 20 mcg/min at 12/30/12 0616  . ondansetron (ZOFRAN) injection 4 mg  4 mg Intravenous Q6H PRN Nathan Finlay, PA-C      . pantoprazole (PROTONIX) EC tablet 40 mg  40 mg Oral Q0600 Abelino Derrick, PA-C   40 mg at 12/30/12 0617  . sodium chloride 0.9 % injection 3 mL  3 mL Intravenous Q12H Marykay Lex, MD      . sodium chloride 0.9 % injection 3 mL  3 mL Intravenous PRN Marykay Lex, MD        Prescriptions prior to admission  Medication Sig Dispense Refill  . aspirin 81 MG tablet Take 81 mg by mouth daily.      Marland Kitchen lisinopril-hydrochlorothiazide (PRINZIDE,ZESTORETIC) 10-12.5 MG per tablet Take 1 tablet by mouth daily.  90 tablet  1  . omeprazole (PRILOSEC) 40 MG capsule Take 1 capsule (40 mg total) by mouth daily.  30 capsule  3  . sucralfate (CARAFATE) 1 GM/10ML suspension Take 10 mL (1 g total) by mouth 4 (four) times daily -  with meals and at bedtime.  420 mL  0    History reviewed. No pertinent family history.   Review of Systems:     Cardiac Review of Systems: Y or N  Chest Pain [  y  ]  Resting SOB [  y ] Exertional SOB  [ y ]  Pollyann Kennedy [place].Etienne  ]   Pedal Edema [n   ]    Palpitations [  n] Syncope  [ n ]   Presyncope [n   ]  General Review of Systems: [Y] = yes [  ]=no Constitional: recent weight change [ y ]; anorexia [ n ]; fatigue [ y ]; nausea  y[  ]; night sweats [n  ]; fever [ n ]; or chills [ n ]  Dental: poor dentition[y  ]; Last Dentist visit: 4 years ago  Eye : blurred vision Cove.Etienne  ]; diplopia [ n  ]; vision changes [n  ];  Amaurosis fugax[n  ]; Resp: cough [ y ];  wheezing[n  ];  hemoptysis[y ]; shortness of breath[ y ]; paroxysmal nocturnal dyspnea[ n ]; dyspnea on exertion[ y ]; or orthopnea[y  ];  GI:  gallstones[ n ], vomiting[n  ];  dysphagia[n  ]; melena[ n ];  hematochezia [ n ]; heartburn[y  ];   Hx of  Colonoscopy[  n]; GU: kidney stones [  y]; hematuria[n  ];   dysuria [n  ];  nocturia[  n];  history of     obstruction [  n]; urinary frequency [ n ]             Skin: rash, swelling[ nn ];, hair loss[  ];  peripheral edema[n  ];  or itching[ n ]; Musculosketetal: myalgias[y  ];  joint swelling[ n ];  joint erythema[ n ];  joint pain[n  ];  back pain[ y ];  Heme/Lymph: bruising[ n ];  bleeding[n  ];  anemia[ n ];  Neuro: TIA[ n ];  headaches[  y];  stroke[  n];  vertigo[y  ];  seizures[ n ];   paresthesiasn[  ];  difficulty walking[ n ];  Psych:depression[n  ]; anxiety[n  ];  Endocrine: diabetes[n  ];  thyroid dysfunction[ n ];  Immunizations: Flu [  ]; Pneumococcal[  ];  Other:  Physical Exam: BP 139/98  Pulse 67  Temp(Src) 98.8 F (37.1 C) (Oral)  Resp 17  Ht 5\' 3"  (1.6 m)  Wt 190 lb 0.6 oz (86.2 kg)  BMI 33.67 kg/m2  SpO2 100%  General appearance: alert, cooperative and no distress Neurologic: intact Heart: regular rate and rhythm, S1, S2 normal, no murmur, click, rub or gallop Lungs: clear to auscultation bilaterally Abdomen: soft, non-tender; bowel sounds normal; no masses,  no organomegaly Extremities: extremities normal, atraumatic, no cyanosis or edema Peripheral pulses: DP and PT palpable bilaterally Skin: No rashes or lesions Genitourinary/rectal: Deferred HEENT : PERRLA, anicteric, pharynx clear of exudates or erythema, fair  dentition Neck: No adenopathy, no thyromegaly, no carotid bruits  Diagnostic Studies & Laboratory data:   CATH: NAMEKepler [facility]ley MRN: [id number]  DOB: [birth year] ADMIT DATE: 12/27/2012  Procedure Date: 12/30/2012  INTERVENTIONAL CARDIOLOGIST: Marykay Lex, M.D., MS  PRIMARY CARE PROVIDER: No PCP Per Patient  PRIMARY CARDIOLOGIST: HILTY / CROITORU  PATIENT: [facility] is a 52 y.o. [name] with PMH of HTN & smoking history who was seen by Mr. Nathan Finlay, PA @ [facility] in consultation for ~2 month history of chest/upper O'Donnell pain with shortness of breath radiating to his back. The pain was initially worse with exertion, and relieved by rest however he has now been having episodes of discomfort at rest. His ECG noted inferior Q waves and significant T-wave inversion with ST depression in V3 to V6. He was admitted on August 16 for planned cardiac catheterization. Echocardiogram was performed it showed normal EF of 55-60% with no regional wall motion abnormalities. There is grade 2 diastolic dysfunction with increased filling pressures. Is also noted moderate holosystolic mitral valve prolapse with moderate centrally posteriorly directed mitral regurgitation.  Over the weekend, despite being on intravenous nitroglycerin glycerin, the patient has continued to have episodes of chest discomfort at rest.  He now presents for cardiac catheterization  PRE-OPERATIVE DIAGNOSIS:  Unstable Angina PROCEDURES PERFORMED:  LEFT  HEART CATHETERIZATION WITH CORONARY ANGIOGRAPHY PROCEDURE:Consent: Risks of procedure as well as the alternatives and risks of each were explained to the (patient/caregiver). Consent for procedure obtained.  Consent for signed by MD and patient with RN witness -- placed on chart.  PROCEDURE: The patient was brought to the 2nd Floor [facility] in the fasting state and prepped and draped in the usual sterile fashion for Right  groin or radial access. A modified Allen's test with plethysmography was performed, revealing excellent Ulnar artery collateral flow. Sterile technique was used including antiseptics, cap, gloves, gown, hand hygiene, mask and sheet. Skin prep: Chlorhexidine.  Time Out: Verified patient identification, verified procedure, site/side was marked, verified correct patient position, special equipment/implants available, medications/allergies/relevent history reviewed, required imaging and test results available. Performed  Access: Right Radial Artery; 6 Fr (Terumo Glide Sheath-Slender) Sheath -- Seldinger technique (Angiocath Micropuncture Kit)  Radial Cocktail - 10 mL via sheath, IV Heparin 4000 units Diagnostic: TIG 4.0 - was advanced initially over Versicore then long exchange safety J-wire with much difficulty but tortuous brachiocephalic branch and aortic arch into the ascending aorta. The catheter was used to engage first the left then the right coronary artery for angiography. And also across the aortic valve for measurement ventricular hemodynamics.  Left Coronary Artery Angiography: TIG 4.0  Right Coronary Artery Angiography: TIG 4 point  LV Hemodynamics: TIG 4.0, LV gram was not performed due to the patient's recent echocardiogram. TR Band: 1150 Hours, 15 mL air; nonocclusive hemostasis assured using plethysmography  MEDICATIONS:  Anesthesia: Local Lidocaine 2 ml Sedation: 1 mg IV Versed, 25 mcg IV fentanyl ;  Premedication: 5 mg oral Valium Omnipaque Contrast: 70 ml  Anticoagulation: IV Heparin 4000 Units  Anti-Platelet Agent: None Hemodynamics:  Central Aortic / Mean Pressures: 98/68 mmHg; 82 mmHg  Left Ventricular Pressures / EDP: 98/7 mmHg; 16 mmHg Left Ventriculography: Not performed  EF: 55-60 % via echocardiogram Coronary Anatomy:  Left Main: Very large caliber vessel that trifurcates into the LAD, Ramus Intermedius, Circumflex; angiographically normal LAD: The vessel begins as a  very small caliber diffusely diseased vessel roughly 70% stenosis from what the size should be; it gives off a very proximal diagonal/optional diagonal branch that is 100% occluded proximally and fills via left to left collaterals. Beyond this, the LAD continues to give off a small caliber septal trunk and then essentially tapers to occlusion with faint distal flow that appears to be via collaterals. Also noted distally is competitive flow, presumably from right to left collaterals.  D1: At least a moderate caliber vessel, and it appeared to be proximally occluded. This fills via left to left collaterals, and covers a large portion of the anterolateral wall  D2: Small-caliber vessel that is occluded right at the occlusion site of the LAD, this also fills very faintly from collateral vessels. (From right coronary angiography this appears to be filled via an RV marginal branch collateral) Left Circumflex: Large-caliber vessel that gives off a moderate size atrial branch, and actually appears to provide collateral flow to the right posterior lateral vessel; there are 2 small obtuse marginal branches before the vessel bifurcates in the AV groove into an OM 1 and small left posterolateral (LPL) system. The LPL system bifurcates and is to small branches, most distal branch has a roughly 80% stenosis.  Lateral OM: Moderate caliber vessel with 2 tandem 90% lesions with diffuse abnormality in the intervening segment. Beyond this the vessel is relatively free of significant disease it courses  along the inferolateral wall. Ramus intermedius: Moderate large-caliber vessel, it is relatively free of disease until roughly 2/3 the way down, where it is 100 % occluded at a small branch vessel  RCA: Large-caliber dominant vessel with diffuse mild proximal disease into an ectatic segment where it major, moderate caliber RV marginal branch takes off. Beyond this section, there is a large ectatic, diffusely irregular /potentially  thrombotic segment with 95-99% (subtotal) occlusion. However there is TIMI 3 flow distally beyond this lesion in a large caliber vessel that bifurcates into the right posterior lateral branch and right posterior descending artery.  RV marginal branch: This is actually a moderate caliber vessel that radiates distally with the anterior branch providing right to left collaterals to the proximal segment of the LAD/diagonal lesion. It also provides right to right collaterals to the posterior lateral branch. RPDA: Moderate caliber vessel with extensive septal perforator and distal vessel collaterals to the LAD, and fills the LAD almost to the proximal/mid occlusion site. The vessel does have an ostial with 60-70% stenosis.  RPL Sysytem:The RPAV is a large caliber vessel that terminates as a very large right posterior lateral branch that covers a major portion of the infero/postero-lateral wall. This vessel was also perfused from left to right collaterals. After it gives off the AV nodal artery, there is a 80-90% stenosis that is distal to the left to right collateral insertion site. PATIENT DISPOSITION:  The patient was transferred to the PACU holding area in a hemodynamicaly stable, chest pain free condition.  The patient tolerated the procedure well, and there were no complications. EBL: < 10 ml  The patient was stable before, during, and after the procedure. POST-OPERATIVE DIAGNOSIS:  Severe multivessel CAD, the likely culprit lesion is the mid RCA subtotal occlusion of this very important vessel as it fills the LAD system is diagonal via collaterals. There is distal disease in this severely diseased RCA. The entire LAD system including 2 diagonal branches are diffusely diseased and occluded in the late via collaterals, the distal ramus intermedius is occluded, and the lateral OM has severe proximal stenoses.  Well-preserved ejection fraction with mildly elevated at LVEDP  Moderate MR with mitral valve  prolapse by echocardiogram PLAN OF CARE:  The patient clearly has surgical disease, CT surgery as the consulted.  Due to the significance and severity of the disease with his ongoing episodes of chest discomfort, he'll be transferred to the TCU for close monitoring. Will restart IV heparin in addition to nitroglycerin. Low threshold for starting Integrilin if pain recurs.  Will titrate beta blocker up to 25 mg twice a day, and restart ACE inhibitor at one half of his home dose.  Due to him having ongoing chest discomfort, with severe multivessel disease, he will not be stable to be discharged for staged CABG. The patient's family and primary cardiologist were both notified.  Marykay Lex, M.D., M.S.  [facility]  [address]  [place], [place] [address]  [phone]    Recent Radiology Findings:  Dg Chest 2 View  12/27/2012   *RADIOLOGY REPORT*  Clinical Data: Chest pain for a few days and  CHEST - 2 VIEW  Comparison: 10/10/2012  Findings: Heart size upper normal and stable.  Mild uncoiling of the aorta is stable.  Vascular pattern normal.  Lungs clear.  Bony thorax intact.  IMPRESSION: Normal study   Original Report Authenticated By: Esperanza Heir, M.D.   Ct Angio Abdomen W/cm &/or Wo Contrast  12/27/2012   *RADIOLOGY REPORT*  Clinical Data:  Abdominal pain radiating into chest and back, evaluate for dissection  CT ANGIOGRAPHY CHEST, ABDOMEN AND PELVIS  Technique:  Multidetector CT imaging through the chest, abdomen and pelvis was performed using the standard protocol during bolus administration of intravenous contrast.  Multiplanar reconstructed images including MIPs were obtained and reviewed to evaluate the vascular anatomy.  Contrast: OMNIPAQUE IOHEXOL 350 MG/ML SOLN  Comparison:   Chest x-ray 12/27/2012; prior CT abdomen/pelvis 04/22/2008  CTA CHEST  Findings:  Mediastinum: Nonspecific heterogeneous appearance of thyroid gland. Probable 12  mm nodule in the left thyroid gland is nonspecific by CT.  Mild circumferential thickening of the distal esophageal wall. No suspicious mediastinal or hilar adenopathy.  No mediastinal mass.  Heart/Vascular: Conventional three-vessel arch anatomy.  No acute intramural hematoma, dissection or evidence of aneurysmal dilatation.  Evaluation of the coronary arteries in cardiac structures slightly limited by noncardiac gated technique.  There is atherosclerotic calcification along the course of the left anterior descending coronary artery.  The heart is at the upper limits of normal for size. Focal hypoattenuation of the myocardium at the inferior ventricular apex with associated thinning suggest remodelling from prior myocardial infarction.  No pericardial effusion. Unremarkable pulmonary artery.  No central embolus.  Lungs/Pleura: Mild dependent atelectasis in the lower lobes.  3 mm nonspecific nodule in the right middle lobe.  A 2.5 mm nodules identified in the central anterior right lower lobe.  The presence of small multi focal pulmonary nodules is highly likely the sequelae of prior infection/inflammation/granulomatous process in the absence of a known primary malignancy.  Mild bilateral lower lobe bronchial wall thickening.  Bones: No acute fracture or aggressive appearing lytic or blastic osseous lesion.   Review of the MIP images confirms the above findings.  IMPRESSION:  1.  Negative for acute aortic pathology or aneurysmal dilatation.  2.  Atherosclerosis including coronary artery disease.  3.  Borderline cardiomegaly with sequelae suggesting prior remote myocardial infarction involving the apex and apical inferior wall.  4.  Mild circumferential thickening of the distal esophageal wall. Recommend clinical correlation for signs and symptoms of gastroesophageal reflux disease.  5.  Probable 12 mm left thyroid nodule is nonspecific by CT. Consider non emergent evaluation with dedicated thyroid ultrasound.  6.  Mild bilateral lower lobe bronchial wall thickening as can be seen in both acute and chronic bronchitis.  7.  Several small (2 - 3 mm) pulmonary nodules.  In the absence of a known primary malignancy, these are highly likely the sequela of a prior infectious/inflammatory or granulomatous process.  CTA ABDOMEN AND PELVIS  Findings:  VASCULAR  Aorta: No evidence of acute intramural hematoma, aneurysmal dilatation or dissection.  Minimal atherosclerotic vascular disease in the distal aorta and aortic bifurcation.  Celiac: Widely patent.  Conventional hepatic arterial anatomy.  SMA: Widely patent.  Renals: Two right and one left renal arteries are widely patent. No evidence of changes of fibromuscular dysplasia.  IMA: There may be mild narrowing at the origin.  The more distal vessel is widely patent.  Inflow: Trace atherosclerotic vascular disease without aneurysmal dilatation or stenosis in the bilateral common iliac arteries.  Veins: Given arterial phase timing evaluation is limited.  No focal abnormality identified.  NON-VASCULAR  Abdomen: Unremarkable CT appearance of the stomach, duodenum, spleen, adrenal glands and pancreas.  Normal hepatic morphology and contour.  No discrete hepatic lesion.  Gallbladder is unremarkable. No intra or extrahepatic biliary ductal dilatation.  Symmetric parenchymal  enhancement of the kidneys.  No hydronephrosis or nephrolithiasis.  No enhancing renal mass.  Tiny sub centimeter bilateral hypoattenuating lesions are too small to characterize but statistically likely benign cysts.  Normal-caliber large and small bowel.  No evidence of bowel obstruction.  Normal appendix identified in the right lower quadrant.  No free fluid or suspicious adenopathy.  Bones: No acute fracture or aggressive appearing lytic or blastic osseous lesion. .  Review of the MIP images confirms the above findings.  IMPRESSION:  1.  No acute aortic pathology or aneurysmal dilatation. 2.  No acute abnormality in  the abdomen or pelvis to explain the patient's clinical symptoms. 3.  Mild atherosclerotic vascular disease without significant stenosis.  Signed,  Sterling Big, MD Vascular & Interventional Radiologist [facility]   Original Report Authenticated By: Malachy Moan, M.D.     Recent Lab Findings: Lab Results  Component Value Date   WBC 8.6 12/30/2012   HGB 12.9* 12/30/2012   HCT 37.8* 12/30/2012   PLT 165 12/30/2012   GLUCOSE 102* 12/30/2012   CHOL 176 12/28/2012   TRIG 184* 12/28/2012   HDL 35* 12/28/2012   LDLCALC 104* 12/28/2012   ALT 23 12/27/2012   AST 19 12/27/2012   NA 137 12/30/2012   K 4.0 12/30/2012   CL 104 12/30/2012   CREATININE 1.10 12/30/2012   BUN 14 12/30/2012   CO2 25 12/30/2012   TSH 0.403 12/27/2012   INR 0.98 12/29/2012   HGBA1C 6.3* 12/27/2012   ECHO: [place]* *[facility]* [address] [place], [place] [address] [phone]  ------------------------------------------------------------ Transthoracic Echocardiography  Patient: Finnean, Cerami MR #: [id number] Study Date: 12/28/2012 Gender: M Age: 47 Height: 160cm Weight: 83.9kg BSA: 1.20m^2 Pt. Status: Room: 3W29C  ADMITTING Croitoru, Mihai ATTENDING Croitoru, Mihai ORDERING Nathan [facility]ley [place], [place] cc:  ------------------------------------------------------------ LV EF: 55% - 60%  ------------------------------------------------------------ Study Conclusions  - Left ventricle: The cavity size was normal. Systolic function was normal. The estimated ejection fraction was in the range of 55% to 60%. Wall motion was normal; there were no regional wall motion abnormalities. Features are consistent with a pseudonormal left ventricular filling pattern, with concomitant abnormal relaxation and increased filling pressure (grade 2 diastolic dysfunction). - Mitral valve: Thickening. Prolapse. Moderate, holosystolicprolapse, involving the anterior  leaflet. Moderate regurgitation directed eccentrically and posteriorly. - Left atrium: The atrium was moderately dilated. - Right atrium: The atrium was mildly dilated. - Atrial septum: No defect or patent foramen ovale was identified. Transthoracic echocardiography. M-mode, complete 2D, spectral Doppler, and color Doppler. Height: Height: 160cm. Height: 63in. Weight: Weight: 83.9kg. Weight: 184.6lb. Body mass index: BMI: 32.8kg/m^2. Body surface area: BSA: 1.63m^2. Patient status: Inpatient.  ------------------------------------------------------------  ------------------------------------------------------------ Left ventricle: The cavity size was normal. Systolic function was normal. The estimated ejection fraction was in the range of 55% to 60%. Wall motion was normal; there were no regional wall motion abnormalities. Features are consistent with a pseudonormal left ventricular filling pattern, with concomitant abnormal relaxation and increased filling pressure (grade 2 diastolic dysfunction).  ------------------------------------------------------------ Aortic valve: Structurally normal valve. Cusp separation was normal. Doppler: Transvalvular velocity was within the normal range. There was no stenosis. No regurgitation.  ------------------------------------------------------------ Aorta: Aortic root: The aortic root was normal in size. Ascending aorta: The ascending aorta was normal in size.  ------------------------------------------------------------ Mitral valve: Thickening. Prolapse. Moderate, holosystolicprolapse, involving the anterior leaflet. Doppler: Moderate regurgitation directed eccentrically and posteriorly. Peak gradient: 4mm Hg (D).  ------------------------------------------------------------ Left atrium: The atrium was moderately  dilated.  ------------------------------------------------------------ Atrial septum: No defect or patent foramen ovale  was identified.  ------------------------------------------------------------ Right ventricle: The cavity size was normal. Wall thickness was normal. Systolic function was normal.  ------------------------------------------------------------ Pulmonic valve: Structurally normal valve. Cusp separation was normal. Doppler: Transvalvular velocity was within the normal range. Trivial regurgitation.  ------------------------------------------------------------ Tricuspid valve: Structurally normal valve. Leaflet separation was normal. Doppler: Transvalvular velocity was within the normal range. Trivial regurgitation.  ------------------------------------------------------------ Pulmonary artery: Systolic pressure was within the normal range.  ------------------------------------------------------------ Right atrium: The atrium was mildly dilated.  ------------------------------------------------------------ Pericardium: There was no pericardial effusion.  ------------------------------------------------------------ Systemic veins: Inferior vena cava: The vessel was normal in size; the respirophasic diameter changes were in the normal range (= 50%); findings are consistent with normal central venous pressure.  ------------------------------------------------------------  2D measurements Normal Doppler measurements Normal Left ventricle Main pulmonary LVID ED, 48.7 mm 43-52 artery chord, Pressure, S 25 mm =30 PLAX Hg LVID ES, 30.2 mm 23-38 Mitral valve chord, Peak E vel 96. cm/s ------ PLAX 5 FS, chord, 38 % >29 Peak A vel 80 cm/s ------ PLAX Deceleration 169 ms 150-23 LVPW, ED 10 mm ------ time 0 IVS/LVPW 1.12 <1.3 Peak 4 mm ------ ratio, ED gradient, D Hg Ventricular septum Peak E/A 1.2 ------ IVS, ED 11.2 mm ------ ratio Aorta Tricuspid valve Root diam, 36 mm ------ Regurg peak 219 cm/s ------ ED vel Left atrium Peak RV-RA 19 mm ------ AP dim 45 mm ------ gradient, S  Hg AP dim 2.41 cm/m^2 <2.2 Max regurg 219 cm/s ------ index vel Right ventricle RVID ED, 31.3 mm 19-38 PLAX  ------------------------------------------------------------ Prepared and Electronically Authenticated by  Croitoru, Mihai 2014-08-16T11:11:[id number]    Assessment / Plan:   Coronary artery bypass grafting( multivessel) Mitral valve will be assessed intraoperatively with TEE  I have recommended CABG and poss mitral repair or replacement  To the patient. Consider surgery WED this week   Delight Ovens MD      [address] [place].[address] [place] [address] Office [phone]   Beeper [phone]  12/30/2012 9:29 PM       GOLD,WAYNE E  12/30/2012 2:15 PM

## 2012-12-30 NOTE — H&P (View-Only) (Signed)
Subjective:  Some chest discomfort last night "not bad"  Objective:  Vital Signs in the last 24 hours: Temp:  [97.4 F (36.3 C)-98.2 F (36.8 C)] 98 F (36.7 C) (08/18 0751) Pulse Rate:  [60-72] 61 (08/18 0751) Resp:  [18-20] 18 (08/18 0751) BP: (109-162)/(61-92) 122/72 mmHg (08/18 0751) SpO2:  [97 %-100 %] 100 % (08/18 0751) Weight:  [190 lb 0.6 oz (86.2 kg)-194 lb 7.1 oz (88.2 kg)] 190 lb 0.6 oz (86.2 kg) (08/18 0400)  Intake/Output from previous day:  Intake/Output Summary (Last 24 hours) at 12/30/12 0832 Last data filed at 12/30/12 0100  Gross per 24 hour  Intake 1439.8 ml  Output      0 ml  Net 1439.8 ml    Physical Exam: General appearance: alert, cooperative and no distress Lungs: basilar crackles on Rt Heart: regular rate and rhythm and 2/6 MR murmur   Rate: 62  Rhythm: normal sinus rhythm  Lab Results:  Recent Labs  12/29/12 0520 12/30/12 0619  WBC 7.8 8.6  HGB 14.3 12.9*  PLT 173 165    Recent Labs  12/28/12 0500 12/30/12 0619  NA 142 137  K 4.0 4.0  CL 107 104  CO2 28 25  GLUCOSE 121* 102*  BUN 16 14  CREATININE 1.23 1.10    Recent Labs  12/28/12 0500 12/28/12 1140  TROPONINI 0.31* <0.30   Hepatic Function Panel  Recent Labs  12/27/12 2230  PROT 6.6  ALBUMIN 3.2*  AST 19  ALT 23  ALKPHOS 59  BILITOT 0.3    Recent Labs  12/28/12 0500  CHOL 176    Recent Labs  12/29/12 1244  INR 0.98    Imaging: Imaging results have been reviewed  Cardiac Studies:  Assessment/Plan:   Principal Problem:   Unstable angina Active Problems:   Abnormal EKG   Essential hypertension   Coronary artery disease- noted on CT   Mitral valve regurgitation- moderate   PLAN: Cath today. He has had chest pain, abnormal EKG, with TWI AVF V4 and V5, one Troponin 0.31, coronary disease on CT, and MR by echo secondary to prolapse (? Ischemic)  Luke Kilroy PA-C Beeper [phone] 12/30/2012, 8:32 AM  I have seen and evaluated the patient  this AM along with Luke Kilroy, PA. I agree with his findings, examination as well as impression recommendations.  51 y/o former smoker with HTN p/w SSCP that initially occurred with exertion, & now at rest - including last PM.  Has r/o for MI, but with recurrent CP (Angina), abnormal ECG, CT Chest evidence of CAD & MR on Echo he is at least moderate to high risk for a true cardiac etiology.    I agree with the plan to proceed with cardiac cath +/- PCI to delineate his anatomy.  Per Dr. Croitory, the procedure with R/B/A/I was discussed in detail & ?s answered.    Plan CATH +/- PCI today.   MD Time with pt: 10 min  HARDING,DAVID W, M.D., M.S. [facility] [address] [place], [place]  [address]  [phone] Pager # [phone] 12/30/2012 8:47 AM      

## 2012-12-30 NOTE — Interval H&P Note (Signed)
History and Physical Interval Note:  12/30/2012 10:45 AM  Nathan Buckley  has presented today for cardiac catheterization, with the diagnosis of Unstable Angina: The various methods of treatment have been discussed with the patient and family. After consideration of risks, benefits and other options for treatment, the patient has consented to  Procedure(s): LEFT HEART CATHETERIZATION WITH CORONARY ANGIOGRAM (N/A) with POSSIBLE PERCUTANEOUS CORONARY INTERVENTION as a surgical intervention .    The patient's history has been reviewed, patient examined, no change in status, stable for surgery.  I have reviewed the patient's chart and labs.  Questions were answered to the patient's satisfaction.    Cath Lab Visit (complete for each Cath Lab visit)  Clinical Evaluation Leading to the Procedure:   ACS: yes  Non-ACS:    Anginal Classification: CCS IV  Anti-ischemic medical therapy: Minimal Therapy (1 class of medications)  Non-Invasive Test Results: No non-invasive testing performed  Prior CABG: No previous CABG  HARDING,DAVID W

## 2012-12-30 NOTE — Progress Notes (Addendum)
ANTICOAGULATION CONSULT NOTE - Follow Up Consult  Pharmacy Consult for Heparin Indication: chest pain/ACS  No Known Allergies  Patient Measurements: Height: 5\' 3"  (160 cm) Weight: 190 lb 0.6 oz (86.2 kg) IBW/kg (Calculated) : 56.9 Heparin Dosing Weight: 75.5kg  Vital Signs: Temp: 98.8 F (37.1 C) (08/18 1224) Temp src: Oral (08/18 1224) BP: 133/90 mmHg (08/18 1230) Pulse Rate: 65 (08/18 1230)  Labs:  Recent Labs  12/27/12 1647 12/27/12 1720 12/27/12 2230 12/27/12 2340 12/28/12 0500  12/28/12 1140 12/29/12 0520 12/29/12 1244 12/29/12 2031 12/30/12 0619  HGB 14.1  --   --   --   --   --  14.0 14.3  --   --  12.9*  HCT 42.6  --   --   --   --   --  41.5 41.7  --   --  37.8*  PLT 187  --   --   --   --   --  188 173  --   --  165  APTT  --   --  37  --   --   --   --   --   --   --   --   LABPROT  --  12.2 12.7  --   --   --   --   --  12.8  --   --   INR  --  0.92 0.97  --   --   --   --   --  0.98  --   --   HEPARINUNFRC  --   --   --   --   --   < >  --  0.90* 0.90* 0.69 0.39  CREATININE 1.16  --  1.10  --  1.23  --   --   --   --   --  1.10  TROPONINI  --   --   --  <0.30 0.31*  --  <0.30  --   --   --   --   < > = values in this interval not displayed.  Estimated Creatinine Clearance: 75.4 ml/min (by C-G formula based on Cr of 1.1).    Assessment: 51yom s/p cath with severe multivessel disease for CVTS consult. Patient to restart heparin 6 hours post TR band removal (done at 3pm). Last heparin rate was 750 units/hr with heparin level= 0.39.  Goal of Therapy:  Heparin level 0.3-0.7 units/ml Monitor platelets by anticoagulation protocol: Yes   Plan:  -Begin heparin at 750 units/hr 6 hours post TR band removal -Heparin level in 6 hours and daily wth CBC daily  Harland German, Pharm D 12/30/2012 1:13 PM

## 2012-12-30 NOTE — Progress Notes (Signed)
Subjective:  Some chest discomfort last night "not bad"  Objective:  Vital Signs in the last 24 hours: Temp:  [97.4 F (36.3 C)-98.2 F (36.8 C)] 98 F (36.7 C) (08/18 0751) Pulse Rate:  [60-72] 61 (08/18 0751) Resp:  [18-20] 18 (08/18 0751) BP: (109-162)/(61-92) 122/72 mmHg (08/18 0751) SpO2:  [97 %-100 %] 100 % (08/18 0751) Weight:  [190 lb 0.6 oz (86.2 kg)-194 lb 7.1 oz (88.2 kg)] 190 lb 0.6 oz (86.2 kg) (08/18 0400)  Intake/Output from previous day:  Intake/Output Summary (Last 24 hours) at 12/30/12 0832 Last data filed at 12/30/12 0100  Gross per 24 hour  Intake 1439.8 ml  Output      0 ml  Net 1439.8 ml    Physical Exam: General appearance: alert, cooperative and no distress Lungs: basilar crackles on Rt Heart: regular rate and rhythm and 2/6 MR murmur   Rate: 62  Rhythm: normal sinus rhythm  Lab Results:  Recent Labs  12/29/12 0520 12/30/12 0619  WBC 7.8 8.6  HGB 14.3 12.9*  PLT 173 165    Recent Labs  12/28/12 0500 12/30/12 0619  NA 142 137  K 4.0 4.0  CL 107 104  CO2 28 25  GLUCOSE 121* 102*  BUN 16 14  CREATININE 1.23 1.10    Recent Labs  12/28/12 0500 12/28/12 1140  TROPONINI 0.31* <0.30   Hepatic Function Panel  Recent Labs  12/27/12 2230  PROT 6.6  ALBUMIN 3.2*  AST 19  ALT 23  ALKPHOS 59  BILITOT 0.3    Recent Labs  12/28/12 0500  CHOL 176    Recent Labs  12/29/12 1244  INR 0.98    Imaging: Imaging results have been reviewed  Cardiac Studies:  Assessment/Plan:   Principal Problem:   Unstable angina Active Problems:   Abnormal EKG   Essential hypertension   Coronary artery disease- noted on CT   Mitral valve regurgitation- moderate   PLAN: Cath today. He has had chest pain, abnormal EKG, with TWI AVF V4 and V5, one Troponin 0.31, coronary disease on CT, and MR by echo secondary to prolapse (? Ischemic)  Corine Shelter PA-C Beeper [phone] 12/30/2012, 8:32 AM  I have seen and evaluated the patient  this AM along with Corine Shelter, PA. I agree with his findings, examination as well as impression recommendations.  52 y/o former smoker with HTN p/w SSCP that initially occurred with exertion, & now at rest - including last PM.  Has r/o for MI, but with recurrent CP (Angina), abnormal ECG, CT Chest evidence of CAD & MR on Echo he is at least moderate to high risk for a true cardiac etiology.    I agree with the plan to proceed with cardiac cath +/- PCI to delineate his anatomy.  Per Dr. Angelena Sole, the procedure with R/B/A/I was discussed in detail & ?s answered.    Plan CATH +/- PCI today.   MD Time with pt: 10 min  HARDING,DAVID W, M.D., M.S. [facility] [address] [place], [place]  [address]  [phone] Pager # [phone] 12/30/2012 8:47 AM

## 2012-12-30 NOTE — Progress Notes (Signed)
ANTICOAGULATION CONSULT NOTE - Follow Up Consult  Pharmacy Consult for Heparin Indication: chest pain/ACS  No Known Allergies  Patient Measurements: Height: 5\' 3"  (160 cm) Weight: 190 lb 0.6 oz (86.2 kg) IBW/kg (Calculated) : 56.9 Heparin Dosing Weight: 75.5kg  Vital Signs: Temp: 98 F (36.7 C) (08/18 0751) BP: 122/72 mmHg (08/18 0751) Pulse Rate: 61 (08/18 0751)  Labs:  Recent Labs  12/27/12 1647 12/27/12 1720 12/27/12 2230 12/27/12 2340 12/28/12 0500  12/28/12 1140 12/29/12 0520 12/29/12 1244 12/29/12 2031 12/30/12 0619  HGB 14.1  --   --   --   --   --  14.0 14.3  --   --  12.9*  HCT 42.6  --   --   --   --   --  41.5 41.7  --   --  37.8*  PLT 187  --   --   --   --   --  188 173  --   --  165  APTT  --   --  37  --   --   --   --   --   --   --   --   LABPROT  --  12.2 12.7  --   --   --   --   --  12.8  --   --   INR  --  0.92 0.97  --   --   --   --   --  0.98  --   --   HEPARINUNFRC  --   --   --   --   --   < >  --  0.90* 0.90* 0.69 0.39  CREATININE 1.16  --  1.10  --  1.23  --   --   --   --   --  1.10  TROPONINI  --   --   --  <0.30 0.31*  --  <0.30  --   --   --   --   < > = values in this interval not displayed.  Estimated Creatinine Clearance: 75.4 ml/min (by C-G formula based on Cr of 1.1).   Medications:  Heparin @ 750 units/hr  Assessment: 51yom continues on heparin with a therapeutic heparin level. Drop in Hgb noted (14.3-->12.9), platelets stable. No bleeding reported. Plan is for cath today.  Goal of Therapy:  Heparin level 0.3-0.7 units/ml Monitor platelets by anticoagulation protocol: Yes   Plan:  1) Continue heparin at 750 units/hr 2) Follow up after cath  Fredrik Rigger 12/30/2012,8:36 AM

## 2012-12-30 NOTE — Care Management Note (Signed)
    Page 1 of 1   12/30/2012     12:27:08 PM   CARE MANAGEMENT NOTE 12/30/2012  Patient:  Nathan Buckley   Account Number:  [id number]  Date Initiated:  12/30/2012  Documentation initiated by:  Junius Creamer  Subjective/Objective Assessment:   adm w ch pain     Action/Plan:   lives w wife   Anticipated DC Date:     Anticipated DC Plan:        DC Planning Services  CM consult      Choice offered to / List presented to:             Status of service:   Medicare Important Message given?   (If response is "NO", the following Medicare IM given date fields will be blank) Date Medicare IM given:   Date Additional Medicare IM given:    Discharge Disposition:    Per UR Regulation:  Reviewed for med. necessity/level of care/duration of stay  If discussed at Long Length of Stay Meetings, dates discussed:    Comments:

## 2012-12-31 ENCOUNTER — Inpatient Hospital Stay (HOSPITAL_COMMUNITY): Payer: PRIVATE HEALTH INSURANCE

## 2012-12-31 DIAGNOSIS — Z01810 Encounter for preprocedural cardiovascular examination: Secondary | ICD-10-CM

## 2012-12-31 LAB — HEPARIN LEVEL (UNFRACTIONATED)
Heparin Unfractionated: <0.1 IU/mL — ABNORMAL LOW (ref 0.30–0.70)
Heparin Unfractionated: <0.1 IU/mL — ABNORMAL LOW (ref 0.30–0.70)

## 2012-12-31 LAB — URINALYSIS, ROUTINE W REFLEX MICROSCOPIC
Bilirubin Urine: NEGATIVE
Hgb urine dipstick: NEGATIVE
Protein, ur: NEGATIVE mg/dL
Urobilinogen, UA: 1 mg/dL (ref 0.0–1.0)

## 2012-12-31 LAB — POCT I-STAT 3, ART BLOOD GAS (G3+)
Acid-base deficit: 2 mmol/L (ref 0.0–2.0)
Bicarbonate: 23.4 mEq/L (ref 20.0–24.0)
pCO2 arterial: 39.1 mmHg (ref 35.0–45.0)
pO2, Arterial: 58 mmHg — ABNORMAL LOW (ref 80.0–100.0)

## 2012-12-31 LAB — CBC
MCH: 24.8 pg — ABNORMAL LOW (ref 26.0–34.0)
MCHC: 34.3 g/dL (ref 30.0–36.0)
Platelets: 175 10*3/uL (ref 150–400)
RBC: 5.48 MIL/uL (ref 4.22–5.81)

## 2012-12-31 LAB — ABO/RH: ABO/RH(D): O POS

## 2012-12-31 LAB — TYPE AND SCREEN
ABO/RH(D): O POS
Antibody Screen: NEGATIVE

## 2012-12-31 MED ORDER — TEMAZEPAM 15 MG PO CAPS: 15.0000 mg | ORAL_CAPSULE | Freq: Once | ORAL | Status: AC | PRN

## 2012-12-31 MED ORDER — DOPAMINE-DEXTROSE 3.2-5 MG/ML-% IV SOLN
2.0000 ug/kg/min | INTRAVENOUS | Status: AC
  Administered 2013-01-01: 3 ug/kg/min via INTRAVENOUS
  Filled 2012-12-31: qty 250

## 2012-12-31 MED ORDER — MAGNESIUM SULFATE 50 % IJ SOLN
40.0000 meq | INTRAMUSCULAR | Status: DC
  Filled 2012-12-31: qty 10

## 2012-12-31 MED ORDER — HEPARIN (PORCINE) IN NACL 100-0.45 UNIT/ML-% IJ SOLN
1200.0000 [IU]/h | INTRAMUSCULAR | Status: DC
  Administered 2012-12-31: 950 [IU]/h via INTRAVENOUS
  Filled 2012-12-31 (×2): qty 250

## 2012-12-31 MED ORDER — SODIUM CHLORIDE 0.9 % IV SOLN
INTRAVENOUS | Status: DC
  Filled 2012-12-31 (×2): qty 30

## 2012-12-31 MED ORDER — BISACODYL 5 MG PO TBEC
5.0000 mg | DELAYED_RELEASE_TABLET | Freq: Once | ORAL | Status: AC
  Administered 2012-12-31: 5 mg via ORAL
  Filled 2012-12-31: qty 1

## 2012-12-31 MED ORDER — VANCOMYCIN HCL 10 G IV SOLR
1500.0000 mg | INTRAVENOUS | Status: AC
  Administered 2013-01-01: 1500 mg via INTRAVENOUS
  Filled 2012-12-31: qty 1500

## 2012-12-31 MED ORDER — ALBUTEROL SULFATE (5 MG/ML) 0.5% IN NEBU
2.5000 mg | INHALATION_SOLUTION | Freq: Once | RESPIRATORY_TRACT | Status: AC
  Administered 2012-12-31: 2.5 mg via RESPIRATORY_TRACT

## 2012-12-31 MED ORDER — SODIUM CHLORIDE 0.9 % IV SOLN
INTRAVENOUS | Status: AC
  Administered 2013-01-01: 70 mL/h via INTRAVENOUS
  Filled 2012-12-31: qty 40

## 2012-12-31 MED ORDER — EPINEPHRINE HCL 1 MG/ML IJ SOLN
0.5000 ug/min | INTRAMUSCULAR | Status: DC
  Filled 2012-12-31: qty 4

## 2012-12-31 MED ORDER — PLASMA-LYTE 148 IV SOLN
INTRAVENOUS | Status: DC
  Filled 2012-12-31: qty 2.5

## 2012-12-31 MED ORDER — METOPROLOL TARTRATE 25 MG PO TABS
37.5000 mg | ORAL_TABLET | Freq: Two times a day (BID) | ORAL | Status: DC
  Administered 2012-12-31 (×2): 37.5 mg via ORAL
  Filled 2012-12-31 (×4): qty 1

## 2012-12-31 MED ORDER — POTASSIUM CHLORIDE 2 MEQ/ML IV SOLN
80.0000 meq | INTRAVENOUS | Status: DC
  Filled 2012-12-31: qty 40

## 2012-12-31 MED ORDER — CHLORHEXIDINE GLUCONATE 4 % EX LIQD
60.0000 mL | Freq: Once | CUTANEOUS | Status: AC
  Administered 2012-12-31: 4 via TOPICAL
  Filled 2012-12-31: qty 30

## 2012-12-31 MED ORDER — CHLORHEXIDINE GLUCONATE 4 % EX LIQD
60.0000 mL | Freq: Once | CUTANEOUS | Status: AC
  Administered 2013-01-01: 4 via TOPICAL
  Filled 2012-12-31: qty 30

## 2012-12-31 MED ORDER — DEXTROSE 5 % IV SOLN
750.0000 mg | INTRAVENOUS | Status: DC
  Filled 2012-12-31: qty 750

## 2012-12-31 MED ORDER — METOPROLOL TARTRATE 12.5 MG HALF TABLET
12.5000 mg | ORAL_TABLET | Freq: Once | ORAL | Status: AC
  Administered 2013-01-01: 12.5 mg via ORAL
  Filled 2012-12-31: qty 1

## 2012-12-31 MED ORDER — SODIUM CHLORIDE 0.9 % IV SOLN
INTRAVENOUS | Status: AC
  Administered 2013-01-01: 1.6 [IU]/h via INTRAVENOUS
  Filled 2012-12-31: qty 1

## 2012-12-31 MED ORDER — DEXMEDETOMIDINE HCL IN NACL 400 MCG/100ML IV SOLN
0.1000 ug/kg/h | INTRAVENOUS | Status: AC
  Administered 2013-01-01: .3 ug/kg/h via INTRAVENOUS
  Filled 2012-12-31: qty 100

## 2012-12-31 MED ORDER — DEXTROSE 5 % IV SOLN
1.5000 g | INTRAVENOUS | Status: AC
  Administered 2013-01-01: 1.5 g via INTRAVENOUS
  Filled 2012-12-31: qty 1.5

## 2012-12-31 MED ORDER — LORAZEPAM 0.5 MG PO TABS: 0.5000 mg | ORAL_TABLET | ORAL | Status: DC | PRN

## 2012-12-31 MED ORDER — DEXTROSE 5 % IV SOLN
30.0000 ug/min | INTRAVENOUS | Status: AC
  Administered 2013-01-01: 10 ug/min via INTRAVENOUS
  Filled 2012-12-31: qty 2

## 2012-12-31 MED ORDER — NITROGLYCERIN IN D5W 200-5 MCG/ML-% IV SOLN
2.0000 ug/min | INTRAVENOUS | Status: AC
  Administered 2013-01-01: 5 ug/min via INTRAVENOUS
  Filled 2012-12-31: qty 250

## 2012-12-31 NOTE — Progress Notes (Signed)
Subjective: Still complaining of CP as well as pain near left shoulder blade.    Objective: Vital signs in last 24 hours: Temp:  [98.5 F (36.9 C)-99.7 F (37.6 C)] 99.2 F (37.3 C) (08/19 0743) Pulse Rate:  [62-83] 65 (08/19 0600) Resp:  [13-21] 16 (08/19 0600) BP: (125-170)/(61-100) 155/88 mmHg (08/19 0600) SpO2:  [94 %-100 %] 97 % (08/19 0600) Weight:  [191 lb 12.8 oz (87 kg)] 191 lb 12.8 oz (87 kg) (08/19 0500) Last BM Date: 12/28/12  Intake/Output from previous day: 08/18 0701 - 08/19 0700 In: 869.1 [I.V.:869.1] Out: 1600 [Urine:1600] Intake/Output this shift:    Medications Current Facility-Administered Medications  Medication Dose Route Frequency Provider Last Rate Last Dose  . 0.9 %  sodium chloride infusion  250 mL Intravenous PRN Marykay Lex, MD 10 mL/hr at 12/31/12 0551 10 mL/hr at 12/31/12 0551  . acetaminophen (TYLENOL) tablet 650 mg  650 mg Oral Q4H PRN Wilburt Finlay, PA-C   650 mg at 12/30/12 1451  . aspirin EC tablet 81 mg  81 mg Oral Daily Wilburt Finlay, PA-C   81 mg at 12/30/12 1017  . atorvastatin (LIPITOR) tablet 10 mg  10 mg Oral q1800 Wilburt Finlay, PA-C   10 mg at 12/29/12 1705  . heparin ADULT infusion 100 units/mL (25000 units/250 mL)  750 Units/hr Intravenous Continuous Benny Lennert, RPH 7.5 mL/hr at 12/31/12 0551 750 Units/hr at 12/31/12 0551  . lisinopril (PRINIVIL,ZESTRIL) tablet 5 mg  5 mg Oral Daily Marykay Lex, MD   5 mg at 12/30/12 1451  . metoprolol tartrate (LOPRESSOR) tablet 25 mg  25 mg Oral BID Marykay Lex, MD   25 mg at 12/30/12 2231  . nitroGLYCERIN (NITROSTAT) SL tablet 0.4 mg  0.4 mg Sublingual Q5 Min x 3 PRN Wilburt Finlay, PA-C      . nitroGLYCERIN 0.2 mg/mL in dextrose 5 % infusion  2-200 mcg/min Intravenous Titrated Quintella Reichert, MD 7.5 mL/hr at 12/31/12 0000 25 mcg/min at 12/31/12 0000  . ondansetron (ZOFRAN) injection 4 mg  4 mg Intravenous Q6H PRN Wilburt Finlay, PA-C      . pantoprazole (PROTONIX) EC tablet 40 mg  40  mg Oral Q0600 Abelino Derrick, PA-C   40 mg at 12/31/12 0551  . sodium chloride 0.9 % injection 3 mL  3 mL Intravenous Q12H Marykay Lex, MD   3 mL at 12/30/12 2000  . sodium chloride 0.9 % injection 3 mL  3 mL Intravenous PRN Marykay Lex, MD        PE: General appearance: alert, cooperative and no distress Lungs: clear to auscultation bilaterally Heart: regular rate and rhythm and 2/6 sys MM loudest at the axillae Extremities: No LEE Pulses: 2+ and symmetric Skin: Warm and dry Neurologic: Grossly normal  Lab Results:   Recent Labs  12/29/12 0520 12/30/12 0619 12/31/12 0650  WBC 7.8 8.6 10.1  HGB 14.3 12.9* 13.6  HCT 41.7 37.8* 39.7  PLT 173 165 175   BMET  Recent Labs  12/30/12 0619  NA 137  K 4.0  CL 104  CO2 25  GLUCOSE 102*  BUN 14  CREATININE 1.10  CALCIUM 8.4   PT/INR  Recent Labs  12/29/12 1244  LABPROT 12.8  INR 0.98   2D echo Study Conclusions  - Left ventricle: The cavity size was normal. Systolic function was normal. The estimated ejection fraction was in the range of 55% to 60%. Wall motion was normal; there  were no regional wall motion abnormalities. Features are consistent with a pseudonormal left ventricular filling pattern, with concomitant abnormal relaxation and increased filling pressure (grade 2 diastolic dysfunction). - Mitral valve: Thickening. Prolapse. Moderate, holosystolicprolapse, involving the anterior leaflet. Moderate regurgitation directed eccentrically and posteriorly. - Left atrium: The atrium was moderately dilated. - Right atrium: The atrium was mildly dilated. - Atrial septum: No defect or patent foramen ovale was identified.   Assessment/Plan   Principal Problem:   Unstable angina Active Problems:   Essential hypertension   Abnormal EKG   Coronary artery disease- noted on CT   Mitral valve regurgitation- moderate  Plan:  SP left heart cath which revealed severe multivessel CAD with mid RCA  subtotal occlusion which fills the LAD system via collaterals.  LAD is diffusly diseased.  CABG recommended by Dr. Tyrone Sage along with possible MV repair due to moderate prolapse.  Possibly on Wednesday.     Titrating NTG to 60mcg/min.  Also will increase lopressor to 37.5mg  twice daily.      LOS: 4 days    HAGER, BRYAN 12/31/2012 8:00 AM  I have seen and examined the patient along with Wilburt Finlay, PA.  I have reviewed the chart, notes and new data.  I agree with PA's note.  Key new complaints: ongoing intermittent chest discomfort Key examination changes: no arrhythmia or signs of CHF Key new findings / data: stable renal function.  PLAN: High risk coronary anatomy and ongoing unstable angina. Keep in hospital until CABG performed.  Thurmon Fair, MD, [facility] [phone] 12/31/2012, 10:04 AM

## 2012-12-31 NOTE — Progress Notes (Signed)
ANTICOAGULATION CONSULT NOTE - Follow Up Consult  Pharmacy Consult for Heparin Indication: unstable angina, CAD. for OHS 8/20  No Known Allergies  Patient Measurements: Height: 5\' 3"  (160 cm) Weight: 191 lb 12.8 oz (87 kg) IBW/kg (Calculated) : 56.9 Heparin Dosing Weight:75.5 kg  Vital Signs: Temp: 99.3 F (37.4 C) (08/19 1600) Temp src: Oral (08/19 1600) BP: 154/92 mmHg (08/19 1700) Pulse Rate: 73 (08/19 1700)  Labs:  Recent Labs  12/29/12 0520 12/29/12 1244  12/30/12 0619 12/31/12 0650 12/31/12 1603  HGB 14.3  --   --  12.9* 13.6  --   HCT 41.7  --   --  37.8* 39.7  --   PLT 173  --   --  165 175  --   LABPROT  --  12.8  --   --   --   --   INR  --  0.98  --   --   --   --   HEPARINUNFRC 0.90* 0.90*  < > 0.39 <0.10* <0.10*  CREATININE  --   --   --  1.10  --   --   < > = values in this interval not displayed.  Estimated Creatinine Clearance: 75.7 ml/min (by C-G formula based on Cr of 1.1).  Assessment:   Heparin drip increased from 750 to 950 units/hr earlier today, but heparin level remains undetectable.  No known IV infusion issues.  Goal of Therapy:  Heparin level 0.3-0.7 units/ml Monitor platelets by anticoagulation protocol: Yes   Plan:   Increase heparin drip to 1200 units/hr.  Heparin level and CBC in am.  For OHS on 8/20.  Dennie Fetters, [place] Pager: [phone] 12/31/2012,7:03 PM

## 2012-12-31 NOTE — Progress Notes (Signed)
Patient ID: Nathan Buckley, male   DOB: 05-21-1960, 52 y.o.   MRN: [id number] TCTS DAILY ICU PROGRESS NOTE                   [address]            [place] [address]          [phone]   1 Day Post-Op Procedure(s) (LRB): LEFT HEART CATHETERIZATION WITH CORONARY ANGIOGRAM (N/A)  Total Length of Stay:  LOS: 4 days   Subjective: No chest pain today.   Objective: Vital signs in last 24 hours: Temp:  [98.5 F (36.9 C)-99.7 F (37.6 C)] 98.6 F (37 C) (08/19 1128) Pulse Rate:  [62-83] 69 (08/19 1600) Cardiac Rhythm:  [-] Normal sinus rhythm (08/19 1600) Resp:  [10-24] 11 (08/19 1600) BP: (125-171)/(61-101) 154/87 mmHg (08/19 1600) SpO2:  [93 %-100 %] 99 % (08/19 1600) Weight:  [191 lb 12.8 oz (87 kg)] 191 lb 12.8 oz (87 kg) (08/19 0500)  Filed Weights   12/29/12 1044 12/30/12 0400 12/31/12 0500  Weight: 194 lb 7.1 oz (88.2 kg) 190 lb 0.6 oz (86.2 kg) 191 lb 12.8 oz (87 kg)    Weight change: -2 lb 10.3 oz (-1.2 kg)   Hemodynamic parameters for last 24 hours:    Intake/Output from previous day: 08/18 0701 - 08/19 0700 In: 869.1 [I.V.:869.1] Out: 1600 [Urine:1600]  Intake/Output this shift: Total I/O In: 667 [P.O.:450; I.V.:217] Out: -   Current Meds: Scheduled Meds: . [START ON 01/01/2013] aminocaproic acid (AMICAR) for OHS   Intravenous To OR  . aspirin EC  81 mg Oral Daily  . atorvastatin  10 mg Oral q1800  . [START ON 01/01/2013] cefUROXime (ZINACEF)  IV  1.5 g Intravenous To OR  . [START ON 01/01/2013] cefUROXime (ZINACEF)  IV  750 mg Intravenous To OR  . [START ON 01/01/2013] dexmedetomidine  0.1-0.7 mcg/kg/hr Intravenous To OR  . [START ON 01/01/2013] DOPamine  2-20 mcg/kg/min Intravenous To OR  . [START ON 01/01/2013] epinephrine  0.5-20 mcg/min Intravenous To OR  . [START ON 01/01/2013] heparin-papaverine-plasmalyte irrigation   Irrigation To OR  . [START ON 01/01/2013] heparin 30,000 units/NS 1000 mL solution for CELLSAVER   Other  To OR  . [START ON 01/01/2013] insulin (NOVOLIN-R) infusion   Intravenous To OR  . [START ON 01/01/2013] magnesium sulfate  40 mEq Other To OR  . metoprolol tartrate  37.5 mg Oral BID  . [START ON 01/01/2013] nitroGLYCERIN  2-200 mcg/min Intravenous To OR  . pantoprazole  40 mg Oral Q0600  . [START ON 01/01/2013] phenylephrine (NEO-SYNEPHRINE) Adult infusion  30-200 mcg/min Intravenous To OR  . [START ON 01/01/2013] potassium chloride  80 mEq Other To OR  . sodium chloride  3 mL Intravenous Q12H  . [START ON 01/01/2013] vancomycin  1,500 mg Intravenous To OR   Continuous Infusions: . heparin 950 Units/hr (12/31/12 1008)  . nitroGLYCERIN 30 mcg/min (12/31/12 0900)   PRN Meds:.sodium chloride, acetaminophen, LORazepam, nitroGLYCERIN, ondansetron (ZOFRAN) IV, sodium chloride  General appearance: alert and cooperative Neurologic: intact Heart: regular rate and rhythm and systolic murmur: systolic ejection 2/6, blowing at 2nd left intercostal space, radiates to axilla Lungs: clear to auscultation bilaterally Abdomen: soft, non-tender; bowel sounds normal; no masses,  no organomegaly Extremities: extremities normal, atraumatic, no cyanosis or edema and Homans sign is negative, no sign of DVT Wound: rt radial cath site intact  Lab Results: CBC: Recent Labs  12/30/12 0619 12/31/12 0650  WBC 8.6 10.1  HGB 12.9* 13.6  HCT 37.8* 39.7  PLT 165 175   BMET:  Recent Labs  12/30/12 0619  NA 137  K 4.0  CL 104  CO2 25  GLUCOSE 102*  BUN 14  CREATININE 1.10  CALCIUM 8.4    PT/INR:  Recent Labs  12/29/12 1244  LABPROT 12.8  INR 0.98   Radiology: No results found.   Assessment/Plan: S/P Procedure(s) (LRB): LEFT HEART CATHETERIZATION WITH CORONARY ANGIOGRAM (N/A)  I have further reviewed the planned procedure with the patient. He asked appropriated questions in English concerning procedure. With unstable angina symptoms and complex CAD,  Cabg is only reasonable option. LV gram  not done mild to moderate MR was noted on echo. Will plan intraoperative TEE and if MR is significant then will plan repair.  The goals risks and alternatives of the planned surgical procedure CABG poss mitral repair replacement  have been discussed with the patient in detail. The risks of the procedure including death, infection, stroke, myocardial infarction, bleeding, blood transfusion have all been discussed specifically.  I have quoted [employer] a 3 % of perioperative mortality and a complication rate as high as 20 %. The patient's questions have been answered.Nathan Buckley is willing  to proceed with the planned procedure.     Nathan Buckley 12/31/2012 4:29 PM

## 2012-12-31 NOTE — Progress Notes (Signed)
Pre-op ed completed. Pt voiced understanding. Instructed to watch video with family later today. OHS books are hard to find right now, will try. [phone] Nathan Buckley CES, ACSM 10:55 AM 12/31/2012

## 2012-12-31 NOTE — Progress Notes (Addendum)
ANTICOAGULATION CONSULT NOTE - Follow Up Consult  Pharmacy Consult for Heparin Indication: chest pain/ACS  No Known Allergies  Patient Measurements: Height: 5\' 3"  (160 cm) Weight: 191 lb 12.8 oz (87 kg) IBW/kg (Calculated) : 56.9 Heparin Dosing Weight: 75.5kg  Vital Signs: Temp: 99.2 F (37.3 C) (08/19 0743) Temp src: Oral (08/19 0743) BP: 134/81 mmHg (08/19 0900) Pulse Rate: 77 (08/19 0900)  Labs:  Recent Labs  12/28/12 1140  12/29/12 0520 12/29/12 1244 12/29/12 2031 12/30/12 0619 12/31/12 0650  HGB 14.0  --  14.3  --   --  12.9* 13.6  HCT 41.5  --  41.7  --   --  37.8* 39.7  PLT 188  --  173  --   --  165 175  LABPROT  --   --   --  12.8  --   --   --   INR  --   --   --  0.98  --   --   --   HEPARINUNFRC  --   < > 0.90* 0.90* 0.69 0.39 <0.10*  CREATININE  --   --   --   --   --  1.10  --   TROPONINI <0.30  --   --   --   --   --   --   < > = values in this interval not displayed.  Estimated Creatinine Clearance: 75.7 ml/min (by C-G formula based on Cr of 1.1).    Assessment: 51yom s/p cath with severe multivessel disease and plans noted for CABG and possible MV repair or replacement. Heparin level this am was < 0.1, per RN there were no infusion interruptions and no concerns noted with the line. Noted plans for CABG and possible MV repair.  Goal of Therapy:  Heparin level 0.3-0.7 units/ml Monitor platelets by anticoagulation protocol: Yes   Plan:  -Increase infusion to 950 units/hr -Heparin level in 6 hours  Harland German, Pharm D 12/31/2012 9:42 AM

## 2012-12-31 NOTE — Progress Notes (Addendum)
Pre-op Cardiac Surgery  Carotid Findings:  1-39% ICA stenosis.  Vertebral artery flow is antegrade.  Upper Extremity Right Left  Brachial Pressures 137 Triphasic 155 Triphasic  Radial Waveforms Triphasic Triphasic  Ulnar Waveforms Biphasic Biphasic  Palmar Arch (Allen's Test) Normal Normal   Findings:  Doppler waveforms remained normal bilaterally with both radial and ulnar compressions.    Lower  Extremity Right Left  Dorsalis Pedis    Anterior Tibial    Posterior Tibial    Ankle/Brachial Indices      Findings:  Palpable pedal pulses bilaterally.

## 2013-01-01 ENCOUNTER — Inpatient Hospital Stay (HOSPITAL_COMMUNITY): Payer: PRIVATE HEALTH INSURANCE

## 2013-01-01 ENCOUNTER — Inpatient Hospital Stay (HOSPITAL_COMMUNITY): Payer: PRIVATE HEALTH INSURANCE | Admitting: Anesthesiology

## 2013-01-01 ENCOUNTER — Encounter: Payer: Self-pay | Admitting: Internal Medicine

## 2013-01-01 ENCOUNTER — Encounter (HOSPITAL_COMMUNITY): Payer: Self-pay | Admitting: Anesthesiology

## 2013-01-01 ENCOUNTER — Encounter (HOSPITAL_COMMUNITY)
Admission: EM | Disposition: A | Discharge status: Home / Self Care | Payer: Self-pay | Source: Home / Self Care | Attending: Cardiovascular Disease

## 2013-01-01 DIAGNOSIS — I2510 Atherosclerotic heart disease of native coronary artery without angina pectoris: Secondary | ICD-10-CM

## 2013-01-01 DIAGNOSIS — Z951 Presence of aortocoronary bypass graft: Secondary | ICD-10-CM

## 2013-01-01 HISTORY — PX: CORONARY ARTERY BYPASS GRAFT: SHX141

## 2013-01-01 HISTORY — PX: INTRAOPERATIVE TRANSESOPHAGEAL ECHOCARDIOGRAM: SHX5062

## 2013-01-01 LAB — POCT I-STAT 3, ART BLOOD GAS (G3+)
Acid-base deficit: 1 mmol/L (ref 0.0–2.0)
Bicarbonate: 25 mEq/L — ABNORMAL HIGH (ref 20.0–24.0)
Bicarbonate: 24.4 mEq/L — ABNORMAL HIGH (ref 20.0–24.0)
O2 Saturation: 100 %
TCO2: 26 mmol/L (ref 0–100)
pCO2 arterial: 46.1 mmHg — ABNORMAL HIGH (ref 35.0–45.0)
pH, Arterial: 7.326 — ABNORMAL LOW (ref 7.350–7.450)
pO2, Arterial: 291 mmHg — ABNORMAL HIGH (ref 80.0–100.0)
pO2, Arterial: 73 mmHg — ABNORMAL LOW (ref 80.0–100.0)

## 2013-01-01 LAB — POCT I-STAT, CHEM 8
BUN: 10 mg/dL (ref 6–23)
Creatinine, Ser: 1.2 mg/dL (ref 0.50–1.35)
Glucose, Bld: 139 mg/dL — ABNORMAL HIGH (ref 70–99)
Hemoglobin: 11.9 g/dL — ABNORMAL LOW (ref 13.0–17.0)
Sodium: 142 mEq/L (ref 135–145)
TCO2: 22 mmol/L (ref 0–100)

## 2013-01-01 LAB — BASIC METABOLIC PANEL
BUN: 11 mg/dL (ref 6–23)
CO2: 25 mEq/L (ref 19–32)
Calcium: 8.8 mg/dL (ref 8.4–10.5)
Chloride: 107 mEq/L (ref 96–112)
Creatinine, Ser: 1.12 mg/dL (ref 0.50–1.35)
GFR calc Af Amer: 85 mL/min — ABNORMAL LOW (ref >90)
GFR calc non Af Amer: 73 mL/min — ABNORMAL LOW (ref >90)
Glucose, Bld: 117 mg/dL — ABNORMAL HIGH (ref 70–99)
Potassium: 3.9 mEq/L (ref 3.5–5.1)
Sodium: 143 mEq/L (ref 135–145)

## 2013-01-01 LAB — POCT I-STAT GLUCOSE: Operator id: 173792

## 2013-01-01 LAB — CBC
HCT: 34.5 % — ABNORMAL LOW (ref 39.0–52.0)
Hemoglobin: 13.1 g/dL (ref 13.0–17.0)
Hemoglobin: 11.6 g/dL — ABNORMAL LOW (ref 13.0–17.0)
MCH: 25 pg — ABNORMAL LOW (ref 26.0–34.0)
MCH: 24.4 pg — ABNORMAL LOW (ref 26.0–34.0)
MCH: 24.4 pg — ABNORMAL LOW (ref 26.0–34.0)
MCHC: 33.6 g/dL (ref 30.0–36.0)
MCV: 72.5 fL — ABNORMAL LOW (ref 78.0–100.0)
Platelets: 103 10*3/uL — ABNORMAL LOW (ref 150–400)
Platelets: 122 10*3/uL — ABNORMAL LOW (ref 150–400)
RBC: 5.25 MIL/uL (ref 4.22–5.81)
RBC: 4.83 MIL/uL (ref 4.22–5.81)
RBC: 4.76 MIL/uL (ref 4.22–5.81)
RDW: 13.9 % (ref 11.5–15.5)
RDW: 14.1 % (ref 11.5–15.5)
WBC: 13.1 10*3/uL — ABNORMAL HIGH (ref 4.0–10.5)
WBC: 11.9 10*3/uL — ABNORMAL HIGH (ref 4.0–10.5)

## 2013-01-01 LAB — CREATININE, SERUM
Creatinine, Ser: 1.1 mg/dL (ref 0.50–1.35)
GFR calc Af Amer: 87 mL/min — ABNORMAL LOW (ref >90)
GFR calc non Af Amer: 75 mL/min — ABNORMAL LOW (ref >90)

## 2013-01-01 LAB — POCT I-STAT 4, (NA,K, GLUC, HGB,HCT)
Glucose, Bld: 113 mg/dL — ABNORMAL HIGH (ref 70–99)
Glucose, Bld: 147 mg/dL — ABNORMAL HIGH (ref 70–99)
HCT: 28 % — ABNORMAL LOW (ref 39.0–52.0)
HCT: 27 % — ABNORMAL LOW (ref 39.0–52.0)
HCT: 30 % — ABNORMAL LOW (ref 39.0–52.0)
Hemoglobin: 12.9 g/dL — ABNORMAL LOW (ref 13.0–17.0)
Hemoglobin: 11.9 g/dL — ABNORMAL LOW (ref 13.0–17.0)
Hemoglobin: 9.5 g/dL — ABNORMAL LOW (ref 13.0–17.0)
Hemoglobin: 9.2 g/dL — ABNORMAL LOW (ref 13.0–17.0)
Hemoglobin: 11.6 g/dL — ABNORMAL LOW (ref 13.0–17.0)
Potassium: 4.7 mEq/L (ref 3.5–5.1)
Potassium: 4.3 mEq/L (ref 3.5–5.1)
Potassium: 3.9 mEq/L (ref 3.5–5.1)
Potassium: 4.3 mEq/L (ref 3.5–5.1)
Sodium: 141 mEq/L (ref 135–145)
Sodium: 140 mEq/L (ref 135–145)

## 2013-01-01 LAB — GLUCOSE, CAPILLARY
Glucose-Capillary: 111 mg/dL — ABNORMAL HIGH (ref 70–99)
Glucose-Capillary: 111 mg/dL — ABNORMAL HIGH (ref 70–99)
Glucose-Capillary: 127 mg/dL — ABNORMAL HIGH (ref 70–99)

## 2013-01-01 LAB — HEMOGLOBIN AND HEMATOCRIT, BLOOD
HCT: 26.7 % — ABNORMAL LOW (ref 39.0–52.0)
Hemoglobin: 9.2 g/dL — ABNORMAL LOW (ref 13.0–17.0)

## 2013-01-01 LAB — PROTIME-INR: Prothrombin Time: 15.6 seconds — ABNORMAL HIGH (ref 11.6–15.2)

## 2013-01-01 LAB — MAGNESIUM: Magnesium: 3.1 mg/dL — ABNORMAL HIGH (ref 1.5–2.5)

## 2013-01-01 LAB — PLATELET COUNT: Platelets: 114 10*3/uL — ABNORMAL LOW (ref 150–400)

## 2013-01-01 LAB — HEPARIN LEVEL (UNFRACTIONATED): Heparin Unfractionated: 0.35 IU/mL (ref 0.30–0.70)

## 2013-01-01 SURGERY — CORONARY ARTERY BYPASS GRAFTING (CABG)
Anesthesia: General | Site: Chest | Wound class: Clean

## 2013-01-01 MED ORDER — MIDAZOLAM HCL 5 MG/5ML IJ SOLN
INTRAMUSCULAR | Status: DC | PRN
  Administered 2013-01-01: 1 mg via INTRAVENOUS
  Administered 2013-01-01: 2 mg via INTRAVENOUS
  Administered 2013-01-01: 4 mg via INTRAVENOUS
  Administered 2013-01-01: 3 mg via INTRAVENOUS

## 2013-01-01 MED ORDER — SODIUM CHLORIDE 0.9 % IV SOLN
INTRAVENOUS | Status: DC
  Administered 2013-01-01: 20 mL/h via INTRAVENOUS

## 2013-01-01 MED ORDER — SODIUM CHLORIDE 0.9 % IJ SOLN
3.0000 mL | Freq: Two times a day (BID) | INTRAMUSCULAR | Status: DC
  Administered 2013-01-02 – 2013-01-05 (×4): 3 mL via INTRAVENOUS

## 2013-01-01 MED ORDER — POTASSIUM CHLORIDE 10 MEQ/50ML IV SOLN
10.0000 meq | INTRAVENOUS | Status: AC
Start: 2013-01-01 — End: 2013-01-01

## 2013-01-01 MED ORDER — SODIUM CHLORIDE 0.9 % IV SOLN
250.0000 mL | INTRAVENOUS | Status: DC
  Administered 2013-01-02: 250 mL via INTRAVENOUS

## 2013-01-01 MED ORDER — NITROGLYCERIN IN D5W 200-5 MCG/ML-% IV SOLN
0.0000 ug/min | INTRAVENOUS | Status: DC
  Administered 2013-01-01: 5 ug/min via INTRAVENOUS

## 2013-01-01 MED ORDER — 0.9 % SODIUM CHLORIDE (POUR BTL) OPTIME
TOPICAL | Status: DC | PRN
  Administered 2013-01-01: 6000 mL

## 2013-01-01 MED ORDER — ARTIFICIAL TEARS OP OINT
TOPICAL_OINTMENT | OPHTHALMIC | Status: DC | PRN
  Administered 2013-01-01: 1 via OPHTHALMIC

## 2013-01-01 MED ORDER — PLASMA-LYTE 148 IV SOLN
INTRAVENOUS | Status: DC
  Filled 2013-01-01: qty 2.5

## 2013-01-01 MED ORDER — ROCURONIUM BROMIDE 100 MG/10ML IV SOLN
INTRAVENOUS | Status: DC | PRN
  Administered 2013-01-01: 30 mg via INTRAVENOUS
  Administered 2013-01-01: 50 mg via INTRAVENOUS
  Administered 2013-01-01: 20 mg via INTRAVENOUS
  Administered 2013-01-01: 80 mg via INTRAVENOUS

## 2013-01-01 MED ORDER — LACTATED RINGERS IV SOLN
INTRAVENOUS | Status: DC | PRN
  Administered 2013-01-01: 09:00:00 via INTRAVENOUS

## 2013-01-01 MED ORDER — DOCUSATE SODIUM 100 MG PO CAPS
200.0000 mg | ORAL_CAPSULE | Freq: Every day | ORAL | Status: DC
  Administered 2013-01-03: 200 mg via ORAL
  Filled 2013-01-01 (×3): qty 2

## 2013-01-01 MED ORDER — ONDANSETRON HCL 4 MG/2ML IJ SOLN
4.0000 mg | Freq: Four times a day (QID) | INTRAMUSCULAR | Status: DC | PRN
  Filled 2013-01-01: qty 2

## 2013-01-01 MED ORDER — SODIUM CHLORIDE 0.9 % IJ SOLN: 3.0000 mL | INTRAMUSCULAR | Status: DC | PRN

## 2013-01-01 MED ORDER — INSULIN REGULAR BOLUS VIA INFUSION
0.0000 [IU] | Freq: Three times a day (TID) | INTRAVENOUS | Status: DC
  Filled 2013-01-01: qty 10

## 2013-01-01 MED ORDER — METOPROLOL TARTRATE 1 MG/ML IV SOLN: 2.5000 mg | INTRAVENOUS | Status: DC | PRN

## 2013-01-01 MED ORDER — PLASMA-LYTE 148 IV SOLN
INTRAVENOUS | Status: AC
  Administered 2013-01-01: 11:00:00
  Filled 2013-01-01: qty 2.5

## 2013-01-01 MED ORDER — ACETAMINOPHEN 650 MG RE SUPP: 650.0000 mg | Freq: Once | RECTAL | Status: AC

## 2013-01-01 MED ORDER — BISACODYL 10 MG RE SUPP: 10.0000 mg | Freq: Every day | RECTAL | Status: DC

## 2013-01-01 MED ORDER — DEXMEDETOMIDINE HCL IN NACL 200 MCG/50ML IV SOLN
0.1000 ug/kg/h | INTRAVENOUS | Status: DC
Start: 2013-01-01 — End: 2013-01-03
  Administered 2013-01-01: 0.7 ug/kg/h via INTRAVENOUS
  Filled 2013-01-01: qty 50

## 2013-01-01 MED ORDER — ASPIRIN 81 MG PO CHEW: 324.0000 mg | CHEWABLE_TABLET | Freq: Every day | ORAL | Status: DC

## 2013-01-01 MED ORDER — MORPHINE SULFATE 2 MG/ML IJ SOLN
2.0000 mg | INTRAMUSCULAR | Status: DC | PRN
  Administered 2013-01-02: 4 mg via INTRAVENOUS
  Administered 2013-01-02 (×2): 2 mg via INTRAVENOUS
  Filled 2013-01-01: qty 1
  Filled 2013-01-01: qty 2
  Filled 2013-01-01: qty 1

## 2013-01-01 MED ORDER — MAGNESIUM SULFATE 40 MG/ML IJ SOLN
INTRAMUSCULAR | Status: AC
  Filled 2013-01-01: qty 100

## 2013-01-01 MED ORDER — MAGNESIUM SULFATE 40 MG/ML IJ SOLN
4.0000 g | Freq: Once | INTRAMUSCULAR | Status: AC
  Administered 2013-01-01: 4 g via INTRAVENOUS

## 2013-01-01 MED ORDER — SODIUM CHLORIDE 0.45 % IV SOLN
INTRAVENOUS | Status: DC
  Administered 2013-01-01: 20 mL/h via INTRAVENOUS

## 2013-01-01 MED ORDER — SODIUM CHLORIDE 0.9 % IV SOLN
INTRAVENOUS | Status: DC
  Administered 2013-01-02: 2.4 [IU]/h via INTRAVENOUS
  Filled 2013-01-01 (×2): qty 1

## 2013-01-01 MED ORDER — BISACODYL 5 MG PO TBEC
10.0000 mg | DELAYED_RELEASE_TABLET | Freq: Every day | ORAL | Status: DC
  Administered 2013-01-03: 10 mg via ORAL
  Filled 2013-01-01: qty 2

## 2013-01-01 MED ORDER — HEPARIN SODIUM (PORCINE) 1000 UNIT/ML IJ SOLN
INTRAMUSCULAR | Status: DC | PRN
  Administered 2013-01-01: 25000 [IU] via INTRAVENOUS

## 2013-01-01 MED ORDER — HEMOSTATIC AGENTS (NO CHARGE) OPTIME
TOPICAL | Status: DC | PRN
  Administered 2013-01-01: 1 via TOPICAL

## 2013-01-01 MED ORDER — MIDAZOLAM HCL 2 MG/2ML IJ SOLN: 2.0000 mg | INTRAMUSCULAR | Status: DC | PRN

## 2013-01-01 MED ORDER — ALBUMIN HUMAN 5 % IV SOLN
INTRAVENOUS | Status: DC | PRN
  Administered 2013-01-01: 15:00:00 via INTRAVENOUS

## 2013-01-01 MED ORDER — MORPHINE SULFATE 2 MG/ML IJ SOLN
1.0000 mg | INTRAMUSCULAR | Status: AC | PRN
Start: 2013-01-01 — End: 2013-01-02
  Administered 2013-01-01: 2 mg via INTRAVENOUS
  Filled 2013-01-01: qty 1

## 2013-01-01 MED ORDER — SODIUM CHLORIDE 0.9 % IJ SOLN
OROMUCOSAL | Status: DC | PRN
  Administered 2013-01-01 (×3): via TOPICAL

## 2013-01-01 MED ORDER — ACETAMINOPHEN 160 MG/5ML PO SOLN
650.0000 mg | Freq: Once | ORAL | Status: AC
  Administered 2013-01-01: 650 mg

## 2013-01-01 MED ORDER — DOPAMINE-DEXTROSE 3.2-5 MG/ML-% IV SOLN
3.0000 ug/kg/min | INTRAVENOUS | Status: DC
Start: 2013-01-01 — End: 2013-01-03

## 2013-01-01 MED ORDER — CEFUROXIME SODIUM 1.5 G IJ SOLR
1.5000 g | Freq: Two times a day (BID) | INTRAMUSCULAR | Status: AC
Start: 2013-01-01 — End: 2013-01-03
  Administered 2013-01-01 – 2013-01-03 (×4): 1.5 g via INTRAVENOUS
  Filled 2013-01-01 (×4): qty 1.5

## 2013-01-01 MED ORDER — PHENYLEPHRINE HCL 10 MG/ML IJ SOLN
0.0000 ug/min | INTRAMUSCULAR | Status: DC
  Administered 2013-01-01: 5 ug/min via INTRAVENOUS
  Administered 2013-01-02: 35 ug/min via INTRAVENOUS
  Filled 2013-01-01 (×2): qty 2

## 2013-01-01 MED ORDER — OXYCODONE HCL 5 MG PO TABS
5.0000 mg | ORAL_TABLET | ORAL | Status: DC | PRN
  Administered 2013-01-02 – 2013-01-03 (×3): 10 mg via ORAL
  Administered 2013-01-03 – 2013-01-04 (×7): 5 mg via ORAL
  Administered 2013-01-04 (×2): 10 mg via ORAL
  Administered 2013-01-04: 5 mg via ORAL
  Administered 2013-01-04: 10 mg via ORAL
  Administered 2013-01-04: 5 mg via ORAL
  Administered 2013-01-05 (×3): 10 mg via ORAL
  Filled 2013-01-01 (×2): qty 2
  Filled 2013-01-01 (×5): qty 1
  Filled 2013-01-01: qty 2
  Filled 2013-01-01 (×2): qty 1
  Filled 2013-01-01 (×2): qty 2
  Filled 2013-01-01: qty 1
  Filled 2013-01-01: qty 2
  Filled 2013-01-01: qty 1
  Filled 2013-01-01 (×3): qty 2

## 2013-01-01 MED ORDER — FENTANYL CITRATE 0.05 MG/ML IJ SOLN
INTRAMUSCULAR | Status: DC | PRN
  Administered 2013-01-01: 500 ug via INTRAVENOUS
  Administered 2013-01-01: 250 ug via INTRAVENOUS
  Administered 2013-01-01 (×2): 100 ug via INTRAVENOUS
  Administered 2013-01-01: 500 ug via INTRAVENOUS
  Administered 2013-01-01: 50 ug via INTRAVENOUS
  Administered 2013-01-01: 250 ug via INTRAVENOUS

## 2013-01-01 MED ORDER — ALBUMIN HUMAN 5 % IV SOLN
250.0000 mL | INTRAVENOUS | Status: AC | PRN
  Administered 2013-01-01 (×2): 250 mL via INTRAVENOUS

## 2013-01-01 MED ORDER — ACETAMINOPHEN 160 MG/5ML PO SOLN
1000.0000 mg | Freq: Four times a day (QID) | ORAL | Status: DC
  Administered 2013-01-01 – 2013-01-04 (×2): 1000 mg
  Filled 2013-01-01: qty 20.3

## 2013-01-01 MED ORDER — ASPIRIN EC 325 MG PO TBEC
325.0000 mg | DELAYED_RELEASE_TABLET | Freq: Every day | ORAL | Status: DC
  Administered 2013-01-02 – 2013-01-05 (×4): 325 mg via ORAL
  Filled 2013-01-01 (×4): qty 1

## 2013-01-01 MED ORDER — LACTATED RINGERS IV SOLN
INTRAVENOUS | Status: DC
  Administered 2013-01-01: 20 mL/h via INTRAVENOUS
  Administered 2013-01-02: 09:00:00 via INTRAVENOUS

## 2013-01-01 MED ORDER — LACTATED RINGERS IV SOLN: 500.0000 mL | Freq: Once | INTRAVENOUS | Status: AC | PRN

## 2013-01-01 MED ORDER — ACETAMINOPHEN 500 MG PO TABS
1000.0000 mg | ORAL_TABLET | Freq: Four times a day (QID) | ORAL | Status: DC
  Administered 2013-01-02 – 2013-01-05 (×12): 1000 mg via ORAL
  Filled 2013-01-01 (×17): qty 2

## 2013-01-01 MED ORDER — FAMOTIDINE IN NACL 20-0.9 MG/50ML-% IV SOLN
20.0000 mg | Freq: Two times a day (BID) | INTRAVENOUS | Status: AC
  Administered 2013-01-01 – 2013-01-02 (×2): 20 mg via INTRAVENOUS
  Filled 2013-01-01: qty 50

## 2013-01-01 MED ORDER — METOPROLOL TARTRATE 12.5 MG HALF TABLET
12.5000 mg | ORAL_TABLET | Freq: Two times a day (BID) | ORAL | Status: DC
  Administered 2013-01-02 – 2013-01-04 (×5): 12.5 mg via ORAL
  Filled 2013-01-01 (×9): qty 1

## 2013-01-01 MED ORDER — PANTOPRAZOLE SODIUM 40 MG PO TBEC
40.0000 mg | DELAYED_RELEASE_TABLET | Freq: Every day | ORAL | Status: DC
  Administered 2013-01-03 – 2013-01-05 (×2): 40 mg via ORAL
  Filled 2013-01-01 (×2): qty 1

## 2013-01-01 MED ORDER — VANCOMYCIN HCL IN DEXTROSE 1-5 GM/200ML-% IV SOLN
1000.0000 mg | Freq: Once | INTRAVENOUS | Status: AC
  Administered 2013-01-01: 1000 mg via INTRAVENOUS
  Filled 2013-01-01: qty 200

## 2013-01-01 MED ORDER — PROPOFOL 10 MG/ML IV BOLUS
INTRAVENOUS | Status: DC | PRN
  Administered 2013-01-01: 100 mg via INTRAVENOUS

## 2013-01-01 MED ORDER — METOPROLOL TARTRATE 25 MG/10 ML ORAL SUSPENSION
12.5000 mg | Freq: Two times a day (BID) | ORAL | Status: DC
  Filled 2013-01-01 (×9): qty 5

## 2013-01-01 MED ORDER — NITROGLYCERIN 2 % TD OINT
TOPICAL_OINTMENT | TRANSDERMAL | Status: DC | PRN
  Administered 2013-01-01: 40 [in_us] via TOPICAL

## 2013-01-01 MED ORDER — FAMOTIDINE IN NACL 20-0.9 MG/50ML-% IV SOLN: 20.0000 mg | Freq: Two times a day (BID) | INTRAVENOUS | Status: DC

## 2013-01-01 MED ORDER — METOCLOPRAMIDE HCL 5 MG/ML IJ SOLN
10.0000 mg | Freq: Four times a day (QID) | INTRAMUSCULAR | Status: AC
  Administered 2013-01-01 – 2013-01-02 (×4): 10 mg via INTRAVENOUS
  Filled 2013-01-01 (×4): qty 2

## 2013-01-01 SURGICAL SUPPLY — 140 items
ADAPTER CARDIO PERF ANTE/RETRO (ADAPTER) ×3 IMPLANT
ATTRACTOMAT 16X20 MAGNETIC DRP (DRAPES) ×3 IMPLANT
BAG DECANTER FOR FLEXI CONT (MISCELLANEOUS) ×3 IMPLANT
BANDAGE ELASTIC 4 VELCRO ST LF (GAUZE/BANDAGES/DRESSINGS) ×3 IMPLANT
BANDAGE ELASTIC 6 VELCRO ST LF (GAUZE/BANDAGES/DRESSINGS) ×3 IMPLANT
BANDAGE GAUZE ELAST BULKY 4 IN (GAUZE/BANDAGES/DRESSINGS) ×3 IMPLANT
BLADE STERNUM SYSTEM 6 (BLADE) ×3 IMPLANT
BLADE SURG 11 STRL SS (BLADE) ×3 IMPLANT
BLADE SURG 15 STRL LF DISP TIS (BLADE) ×4 IMPLANT
BLADE SURG 15 STRL SS (BLADE) ×2
BLADE SURG ROTATE 9660 (MISCELLANEOUS) ×3 IMPLANT
CANISTER SUCTION 2500CC (MISCELLANEOUS) ×3 IMPLANT
CANN PRFSN .5XCNCT 15X34-48 (MISCELLANEOUS) ×2
CANNULA AORTIC HI-FLOW 6.5M20F (CANNULA) ×3 IMPLANT
CANNULA ARTERIAL NVNT 3/8 22FR (MISCELLANEOUS) IMPLANT
CANNULA GUNDRY RCSP 15FR (MISCELLANEOUS) ×3 IMPLANT
CANNULA PRFSN .5XCNCT 15X34-48 (MISCELLANEOUS) ×2 IMPLANT
CANNULA VEN 1 STAGE STR 66236 (MISCELLANEOUS) IMPLANT
CANNULA VEN 2 STAGE (MISCELLANEOUS) ×1
CATH CPB KIT GERHARDT (MISCELLANEOUS) ×3 IMPLANT
CATH HEART VENT LEFT (CATHETERS) IMPLANT
CATH ROBINSON RED A/P 18FR (CATHETERS) ×6 IMPLANT
CATH THORACIC 28FR (CATHETERS) ×3 IMPLANT
CATH THORACIC 36FR (CATHETERS) IMPLANT
CATH THORACIC 36FR RT ANG (CATHETERS) IMPLANT
CLIP FOGARTY SPRING 6M (CLIP) IMPLANT
CLIP TI MEDIUM 24 (CLIP) IMPLANT
CLIP TI WIDE RED SMALL 24 (CLIP) ×9 IMPLANT
CLOTH BEACON ORANGE TIMEOUT ST (SAFETY) ×3 IMPLANT
CONN 1/2X1/2X1/2  BEN (MISCELLANEOUS)
CONN 1/2X1/2X1/2 BEN (MISCELLANEOUS) IMPLANT
CONN 3/8X1/2 ST GISH (MISCELLANEOUS) IMPLANT
CONN Y 3/8X3/8X3/8  BEN (MISCELLANEOUS)
CONN Y 3/8X3/8X3/8 BEN (MISCELLANEOUS) IMPLANT
COVER SURGICAL LIGHT HANDLE (MISCELLANEOUS) ×6 IMPLANT
CRADLE DONUT ADULT HEAD (MISCELLANEOUS) ×3 IMPLANT
DERMABOND ADVANCED (GAUZE/BANDAGES/DRESSINGS) ×1
DERMABOND ADVANCED .7 DNX12 (GAUZE/BANDAGES/DRESSINGS) ×2 IMPLANT
DRAIN CHANNEL 28F RND 3/8 FF (WOUND CARE) ×3 IMPLANT
DRAPE CARDIOVASCULAR INCISE (DRAPES) ×1
DRAPE SLUSH/WARMER DISC (DRAPES) ×3 IMPLANT
DRAPE SRG 135X102X78XABS (DRAPES) ×2 IMPLANT
DRSG AQUACEL AG ADV 3.5X10 (GAUZE/BANDAGES/DRESSINGS) ×6 IMPLANT
DRSG COVADERM 4X14 (GAUZE/BANDAGES/DRESSINGS) IMPLANT
ELECT BLADE 4.0 EZ CLEAN MEGAD (MISCELLANEOUS) ×3
ELECT CAUTERY BLADE 6.4 (BLADE) ×3 IMPLANT
ELECT REM PT RETURN 9FT ADLT (ELECTROSURGICAL) ×6
ELECTRODE BLDE 4.0 EZ CLN MEGD (MISCELLANEOUS) ×2 IMPLANT
ELECTRODE REM PT RTRN 9FT ADLT (ELECTROSURGICAL) ×4 IMPLANT
GLOVE BIO SURGEON STRL SZ 6 (GLOVE) IMPLANT
GLOVE BIO SURGEON STRL SZ 6.5 (GLOVE) ×36 IMPLANT
GLOVE BIO SURGEON STRL SZ7 (GLOVE) ×18 IMPLANT
GLOVE BIO SURGEON STRL SZ7.5 (GLOVE) IMPLANT
GLOVE BIOGEL PI IND STRL 6 (GLOVE) IMPLANT
GLOVE BIOGEL PI IND STRL 6.5 (GLOVE) ×14 IMPLANT
GLOVE BIOGEL PI IND STRL 7.0 (GLOVE) ×4 IMPLANT
GLOVE BIOGEL PI INDICATOR 6 (GLOVE)
GLOVE BIOGEL PI INDICATOR 6.5 (GLOVE) ×7
GLOVE BIOGEL PI INDICATOR 7.0 (GLOVE) ×2
GOWN STRL NON-REIN LRG LVL3 (GOWN DISPOSABLE) ×30 IMPLANT
HEMOSTAT POWDER SURGIFOAM 1G (HEMOSTASIS) ×9 IMPLANT
HEMOSTAT SURGICEL 2X14 (HEMOSTASIS) ×3 IMPLANT
INSERT FOGARTY 61MM (MISCELLANEOUS) IMPLANT
INSERT FOGARTY XLG (MISCELLANEOUS) IMPLANT
KIT BASIN OR (CUSTOM PROCEDURE TRAY) ×3 IMPLANT
KIT ROOM TURNOVER OR (KITS) ×3 IMPLANT
KIT SUCTION CATH 14FR (SUCTIONS) ×9 IMPLANT
KIT VASOVIEW W/TROCAR VH 2000 (KITS) ×3 IMPLANT
LEAD PACING MYOCARDI (MISCELLANEOUS) ×3 IMPLANT
MARKER GRAFT CORONARY BYPASS (MISCELLANEOUS) ×9 IMPLANT
NS IRRIG 1000ML POUR BTL (IV SOLUTION) ×18 IMPLANT
PACK OPEN HEART (CUSTOM PROCEDURE TRAY) ×3 IMPLANT
PAD ARMBOARD 7.5X6 YLW CONV (MISCELLANEOUS) ×6 IMPLANT
PAD ELECT DEFIB RADIOL ZOLL (MISCELLANEOUS) ×3 IMPLANT
PENCIL BUTTON HOLSTER BLD 10FT (ELECTRODE) ×3 IMPLANT
PUNCH AORTIC ROTATE 4.0MM (MISCELLANEOUS) IMPLANT
PUNCH AORTIC ROTATE 4.5MM 8IN (MISCELLANEOUS) ×3 IMPLANT
PUNCH AORTIC ROTATE 5MM 8IN (MISCELLANEOUS) IMPLANT
SET CARDIOPLEGIA MPS 5001102 (MISCELLANEOUS) ×3 IMPLANT
SOLUTION ANTI FOG 6CC (MISCELLANEOUS) IMPLANT
SPONGE GAUZE 4X4 12PLY (GAUZE/BANDAGES/DRESSINGS) ×6 IMPLANT
SPONGE LAP 18X18 X RAY DECT (DISPOSABLE) ×9 IMPLANT
SPONGE LAP 4X18 X RAY DECT (DISPOSABLE) IMPLANT
SUCKER WEIGHTED FLEX (MISCELLANEOUS) ×3 IMPLANT
SUT BONE WAX W31G (SUTURE) ×3 IMPLANT
SUT ETHIBOND 2 0 SH (SUTURE) ×7 IMPLANT
SUT ETHIBOND 2 0 SH 36X2 (SUTURE) ×8 IMPLANT
SUT ETHIBOND 2 0 V4 (SUTURE) IMPLANT
SUT ETHIBOND 2 0V4 GREEN (SUTURE) IMPLANT
SUT MNCRL AB 4-0 PS2 18 (SUTURE) ×3 IMPLANT
SUT PROLENE 3 0 SH 1 (SUTURE) ×3 IMPLANT
SUT PROLENE 3 0 SH DA (SUTURE) IMPLANT
SUT PROLENE 3 0 SH1 36 (SUTURE) ×3 IMPLANT
SUT PROLENE 4 0 RB 1 (SUTURE) ×2
SUT PROLENE 4 0 SH DA (SUTURE) IMPLANT
SUT PROLENE 4 0 TF (SUTURE) ×6 IMPLANT
SUT PROLENE 4-0 RB1 .5 CRCL 36 (SUTURE) ×4 IMPLANT
SUT PROLENE 5 0 C 1 36 (SUTURE) ×6 IMPLANT
SUT PROLENE 5 0 CC1 (SUTURE) IMPLANT
SUT PROLENE 6 0 C 1 30 (SUTURE) IMPLANT
SUT PROLENE 6 0 CC (SUTURE) ×12 IMPLANT
SUT PROLENE 7 0 BV 1 (SUTURE) ×6 IMPLANT
SUT PROLENE 7 0 BV1 MDA (SUTURE) ×6 IMPLANT
SUT PROLENE 7.0 RB 3 (SUTURE) IMPLANT
SUT PROLENE 8 0 BV175 6 (SUTURE) ×21 IMPLANT
SUT SILK  1 MH (SUTURE) ×2
SUT SILK 1 MH (SUTURE) ×4 IMPLANT
SUT SILK 1 TIES 10X30 (SUTURE) IMPLANT
SUT SILK 2 0 (SUTURE)
SUT SILK 2 0 SH CR/8 (SUTURE) IMPLANT
SUT SILK 2-0 18XBRD TIE 12 (SUTURE) IMPLANT
SUT SILK 3 0 SH CR/8 (SUTURE) IMPLANT
SUT SILK 4 0 (SUTURE)
SUT SILK 4-0 18XBRD TIE 12 (SUTURE) IMPLANT
SUT STEEL 6MS V (SUTURE) ×3 IMPLANT
SUT STEEL STERNAL CCS#1 18IN (SUTURE) IMPLANT
SUT STEEL SZ 6 DBL 3X14 BALL (SUTURE) ×3 IMPLANT
SUT TEM PAC WIRE 2 0 SH (SUTURE) IMPLANT
SUT VIC AB 1 CTX 18 (SUTURE) ×6 IMPLANT
SUT VIC AB 1 CTX 36 (SUTURE)
SUT VIC AB 1 CTX36XBRD ANBCTR (SUTURE) IMPLANT
SUT VIC AB 2-0 CT1 27 (SUTURE) ×1
SUT VIC AB 2-0 CT1 TAPERPNT 27 (SUTURE) ×2 IMPLANT
SUT VIC AB 2-0 CTX 27 (SUTURE) IMPLANT
SUT VIC AB 3-0 SH 27 (SUTURE)
SUT VIC AB 3-0 SH 27X BRD (SUTURE) IMPLANT
SUT VIC AB 3-0 X1 27 (SUTURE) ×3 IMPLANT
SUT VICRYL 4-0 PS2 18IN ABS (SUTURE) IMPLANT
SUTURE E-PAK OPEN HEART (SUTURE) ×3 IMPLANT
SYSTEM SAHARA CHEST DRAIN ATS (WOUND CARE) ×3 IMPLANT
TAPE CLOTH SURG 4X10 WHT LF (GAUZE/BANDAGES/DRESSINGS) ×3 IMPLANT
TOWEL OR 17X24 6PK STRL BLUE (TOWEL DISPOSABLE) ×6 IMPLANT
TOWEL OR 17X26 10 PK STRL BLUE (TOWEL DISPOSABLE) ×6 IMPLANT
TRAY FOLEY IC TEMP SENS 14FR (CATHETERS) ×3 IMPLANT
TUBE FEEDING 8FR 16IN STR KANG (MISCELLANEOUS) ×3 IMPLANT
TUBE SUCT INTRACARD DLP 20F (MISCELLANEOUS) ×3 IMPLANT
TUBING INSUFFLATION 10FT LAP (TUBING) ×3 IMPLANT
UNDERPAD 30X30 INCONTINENT (UNDERPADS AND DIAPERS) ×3 IMPLANT
VENT LEFT HEART 12002 (CATHETERS)
WATER STERILE IRR 1000ML POUR (IV SOLUTION) ×6 IMPLANT

## 2013-01-01 NOTE — Transfer of Care (Signed)
Immediate Anesthesia Transfer of Care Note  Patient: Nathan Buckley  Procedure(s) Performed: Procedure(s): CORONARY ARTERY BYPASS GRAFTING times four using Right Greater Saphenous Vein Graft harvested endoscopically and Left Internal Mammary Artery (N/A) INTRAOPERATIVE TRANSESOPHAGEAL ECHOCARDIOGRAM (N/A)  Patient Location: SICU  Anesthesia Type:General  Level of Consciousness: Patient remains intubated per anesthesia plan  Airway & Oxygen Therapy: Patient remains intubated per anesthesia plan and Patient placed on Ventilator (see vital sign flow sheet for setting)  Post-op Assessment: Report given to PACU RN and Post -op Vital signs reviewed and stable  Post vital signs: Reviewed and stable  Complications: No apparent anesthesia complications

## 2013-01-01 NOTE — Anesthesia Preprocedure Evaluation (Addendum)
Anesthesia Evaluation  Patient identified by MRN, date of birth, ID band Patient awake    Reviewed: Allergy & Precautions, H&P , NPO status , Patient's Chart, lab work & pertinent test results  History of Anesthesia Complications Negative for: history of anesthetic complications  Airway Mallampati: II TM Distance: >3 FB Neck ROM: Full    Dental  (+) Teeth Intact and Dental Advisory Given   Pulmonary neg pulmonary ROS, former smoker,    Pulmonary exam normal       Cardiovascular hypertension, Pt. on medications + angina + CAD Rhythm:Regular Rate:Normal     Neuro/Psych negative neurological ROS  negative psych ROS   GI/Hepatic Neg liver ROS, GERD-  Medicated,  Endo/Other  negative endocrine ROS  Renal/GU negative Renal ROS     Musculoskeletal   Abdominal Normal abdominal exam  (+)   Peds  Hematology   Anesthesia Other Findings   Reproductive/Obstetrics                          Anesthesia Physical Anesthesia Plan  ASA: IV  Anesthesia Plan: General   Post-op Pain Management:    Induction: Intravenous  Airway Management Planned: Oral ETT  Additional Equipment: PA Cath, 3D TEE and Arterial line  Intra-op Plan:   Post-operative Plan: Post-operative intubation/ventilation  Informed Consent: I have reviewed the patients History and Physical, chart, labs and discussed the procedure including the risks, benefits and alternatives for the proposed anesthesia with the patient or authorized representative who has indicated his/her understanding and acceptance.   Dental advisory given  Plan Discussed with: CRNA, Anesthesiologist and Surgeon  Anesthesia Plan Comments:         Anesthesia Quick Evaluation

## 2013-01-01 NOTE — Progress Notes (Signed)
  Echocardiogram Echocardiogram Transesophageal has been performed.  Nathan Buckley 01/01/2013, 10:48 AM

## 2013-01-01 NOTE — OR Nursing (Signed)
First call made to 2300 at 1427 and second call made to 2300 at 1453; spoke with Lynnea Ferrier.

## 2013-01-01 NOTE — Brief Op Note (Addendum)
12/27/2012 - 01/01/2013  1:12 PM  PATIENT:  Khounpraseuth Filippone  52 y.o. male  PRE-OPERATIVE DIAGNOSIS:  1.Coronary Artery Disease 2.Mild  Mitral Regurgitation  POST-OPERATIVE DIAGNOSIS:  1.Coronary Artery Disease 2.Mild Mitral Regurgitation on TEE  PROCEDURE:  INTRAOPERATIVE TRANSESOPHAGEAL ECHOCARDIOGRAM, MEDIAN STERNOTOMY for CORONARY ARTERY BYPASS GRAFTING x 4 (LIMA to LAD, SVg to DIAGONAL 1 , SVG to DISTAL CIRCUMFLEX, and SVG to RCA) with EVH from right thigh and lower leg  SURGEON:  Surgeon(s) and Role:    * Delight Ovens, MD - Primary  PHYSICIAN ASSISTANT: Doree Fudge PA-C   ANESTHESIA:   general  EBL:  Total I/O In: -  Out: 400 [Urine:400]   DRAINS: 1 Chest Tube(s) in the  pleural spaces  And blake drain in mediastinum  COUNTS CORRECT:  YES  DICTATION: .Dragon Dictation  PLAN OF CARE: Admit to inpatient   PATIENT DISPOSITION:  ICU - intubated and hemodynamically stable.   Delay start of Pharmacological VTE agent (>24hrs) due to surgical blood loss or risk of bleeding: yes  PRE OP WEIGHT: 87 kg

## 2013-01-01 NOTE — Progress Notes (Signed)
To the OR by bed, stable.. Belongings  taken by family.

## 2013-01-01 NOTE — Preoperative (Signed)
Beta Blockers   Reason not to administer Beta Blockers:Not Applicable 

## 2013-01-01 NOTE — Anesthesia Postprocedure Evaluation (Signed)
  Anesthesia Post-op Note  Patient: Animal nutritionist  Procedure(s) Performed: Procedure(s): CORONARY ARTERY BYPASS GRAFTING times four using Right Greater Saphenous Vein Graft harvested endoscopically and Left Internal Mammary Artery (N/A) INTRAOPERATIVE TRANSESOPHAGEAL ECHOCARDIOGRAM (N/A)  Patient Location: SICU  Anesthesia Type:General  Level of Consciousness: Patient remains intubated per anesthesia plan  Airway and Oxygen Therapy: Patient remains intubated per anesthesia plan and Patient placed on Ventilator (see vital sign flow sheet for setting)  Post-op Pain: none  Post-op Assessment: Post-op Vital signs reviewed, Patient's Cardiovascular Status Stable, Respiratory Function Stable and Patent Airway  Post-op Vital Signs: Reviewed and stable  Complications: No apparent anesthesia complications

## 2013-01-01 NOTE — OR Nursing (Signed)
Rectal Probe inserted without difficulty prior to patient prep by S. Caileen Veracruz RN. 

## 2013-01-01 NOTE — Progress Notes (Signed)
Patient ID: Nathan Buckley, male   DOB: 12/14/1960, 52 y.o.   MRN: [id number] EVENING ROUNDS NOTE :     [address]       [name] [address]             [phone]                 Day of Surgery Procedure(s) (LRB): CORONARY ARTERY BYPASS GRAFTING times four using Right Greater Saphenous Vein Graft harvested endoscopically and Left Internal Mammary Artery (N/A) INTRAOPERATIVE TRANSESOPHAGEAL ECHOCARDIOGRAM (N/A)  Total Length of Stay:  LOS: 5 days  BP 93/64  Pulse 96  Temp(Src) 98.8 F (37.1 C) (Oral)  Resp 23  Ht 5\' 3"  (1.6 m)  Wt 192 lb 10.9 oz (87.4 kg)  BMI 34.14 kg/m2  SpO2 100%  .Intake/Output     08/20 0701 - 08/21 0700   P.O.    I.V. (mL/kg) 2104.7 (24.1)   Blood 1036   NG/GT 60   IV Piggyback 700   Total Intake(mL/kg) 3900.7 (44.6)   Urine (mL/kg/hr) 1950 (1.7)   Emesis/NG output 100 (0.1)   Total Output 2050   Net +1850.7         . sodium chloride 20 mL/hr (01/01/13 1530)  . sodium chloride 20 mL/hr (01/01/13 1530)  . [START ON 01/02/2013] sodium chloride    . dexmedetomidine 0.7 mcg/kg/hr (01/01/13 1545)  . DOPamine Stopped (01/01/13 1545)  . insulin (NOVOLIN-R) infusion 1.6 Units/hr (01/01/13 1709)  . lactated ringers 20 mL/hr (01/01/13 1654)  . nitroGLYCERIN 5 mcg/min (01/01/13 1545)  . phenylephrine (NEO-SYNEPHRINE) Adult infusion 2 mcg/min (01/01/13 1700)     Lab Results  Component Value Date   WBC 13.1* 01/01/2013   HGB 11.6* 01/01/2013   HCT 34.0* 01/01/2013   PLT 103* 01/01/2013   GLUCOSE 127* 01/01/2013   CHOL 176 12/28/2012   TRIG 184* 12/28/2012   HDL 35* 12/28/2012   LDLCALC 104* 12/28/2012   ALT 23 12/27/2012   AST 19 12/27/2012   NA 141 01/01/2013   K 4.2 01/01/2013   CL 107 01/01/2013   CREATININE 1.12 01/01/2013   BUN 11 01/01/2013   CO2 25 01/01/2013   TSH 0.403 12/27/2012   INR 1.27 01/01/2013   HGBA1C 6.3* 12/27/2012   Still asleep Not bleeding   Delight Ovens MD  Beeper [phone] Office  [phone] 01/01/2013 7:59 PM

## 2013-01-02 ENCOUNTER — Inpatient Hospital Stay (HOSPITAL_COMMUNITY): Payer: PRIVATE HEALTH INSURANCE

## 2013-01-02 ENCOUNTER — Other Ambulatory Visit: Payer: Self-pay

## 2013-01-02 LAB — BASIC METABOLIC PANEL
BUN: 11 mg/dL (ref 6–23)
Calcium: 8.1 mg/dL — ABNORMAL LOW (ref 8.4–10.5)
Chloride: 109 mEq/L (ref 96–112)
Creatinine, Ser: 1.2 mg/dL (ref 0.50–1.35)
GFR calc Af Amer: 78 mL/min — ABNORMAL LOW (ref >90)
GFR calc non Af Amer: 67 mL/min — ABNORMAL LOW (ref >90)

## 2013-01-02 LAB — CBC
HCT: 32.2 % — ABNORMAL LOW (ref 39.0–52.0)
MCHC: 33.5 g/dL (ref 30.0–36.0)
MCV: 73 fL — ABNORMAL LOW (ref 78.0–100.0)
MCV: 73.8 fL — ABNORMAL LOW (ref 78.0–100.0)
Platelets: 120 10*3/uL — ABNORMAL LOW (ref 150–400)
Platelets: 129 10*3/uL — ABNORMAL LOW (ref 150–400)
RBC: 4.61 MIL/uL (ref 4.22–5.81)
RDW: 14.2 % (ref 11.5–15.5)
RDW: 14.5 % (ref 11.5–15.5)
WBC: 11.2 10*3/uL — ABNORMAL HIGH (ref 4.0–10.5)
WBC: 14.2 10*3/uL — ABNORMAL HIGH (ref 4.0–10.5)

## 2013-01-02 LAB — POCT I-STAT 3, ART BLOOD GAS (G3+)
Acid-base deficit: 3 mmol/L — ABNORMAL HIGH (ref 0.0–2.0)
Bicarbonate: 21.5 mEq/L (ref 20.0–24.0)
O2 Saturation: 97 %
Patient temperature: 38.6
pCO2 arterial: 40.9 mmHg (ref 35.0–45.0)
pH, Arterial: 7.365 (ref 7.350–7.450)
pH, Arterial: 7.37 (ref 7.350–7.450)
pO2, Arterial: 98 mmHg (ref 80.0–100.0)

## 2013-01-02 LAB — MAGNESIUM
Magnesium: 2.7 mg/dL — ABNORMAL HIGH (ref 1.5–2.5)
Magnesium: 2.4 mg/dL (ref 1.5–2.5)

## 2013-01-02 LAB — POCT I-STAT, CHEM 8
BUN: 13 mg/dL (ref 6–23)
Calcium, Ion: 1.2 mmol/L (ref 1.12–1.23)
Chloride: 108 mEq/L (ref 96–112)
Creatinine, Ser: 1.3 mg/dL (ref 0.50–1.35)
Glucose, Bld: 142 mg/dL — ABNORMAL HIGH (ref 70–99)
TCO2: 21 mmol/L (ref 0–100)

## 2013-01-02 LAB — CREATININE, SERUM
Creatinine, Ser: 1.2 mg/dL (ref 0.50–1.35)
GFR calc Af Amer: 78 mL/min — ABNORMAL LOW (ref >90)
GFR calc non Af Amer: 67 mL/min — ABNORMAL LOW (ref >90)

## 2013-01-02 LAB — GLUCOSE, CAPILLARY
Glucose-Capillary: 117 mg/dL — ABNORMAL HIGH (ref 70–99)
Glucose-Capillary: 106 mg/dL — ABNORMAL HIGH (ref 70–99)
Glucose-Capillary: 93 mg/dL (ref 70–99)
Glucose-Capillary: 112 mg/dL — ABNORMAL HIGH (ref 70–99)
Glucose-Capillary: 109 mg/dL — ABNORMAL HIGH (ref 70–99)
Glucose-Capillary: 164 mg/dL — ABNORMAL HIGH (ref 70–99)

## 2013-01-02 MED ORDER — INSULIN ASPART 100 UNIT/ML ~~LOC~~ SOLN
0.0000 [IU] | SUBCUTANEOUS | Status: DC
  Administered 2013-01-02: 2 [IU] via SUBCUTANEOUS
  Administered 2013-01-02: 4 [IU] via SUBCUTANEOUS

## 2013-01-02 MED FILL — Sodium Chloride Irrigation Soln 0.9%: Qty: 3000 | Status: AC

## 2013-01-02 MED FILL — Sodium Bicarbonate IV Soln 8.4%: INTRAVENOUS | Qty: 50 | Status: AC

## 2013-01-02 MED FILL — Heparin Sodium (Porcine) Inj 1000 Unit/ML: INTRAMUSCULAR | Qty: 20 | Status: AC

## 2013-01-02 MED FILL — Mannitol IV Soln 20%: INTRAVENOUS | Qty: 500 | Status: AC

## 2013-01-02 MED FILL — Electrolyte-R (PH 7.4) Solution: INTRAVENOUS | Qty: 3000 | Status: AC

## 2013-01-02 MED FILL — Lidocaine HCl IV Inj 20 MG/ML: INTRAVENOUS | Qty: 5 | Status: AC

## 2013-01-02 NOTE — Progress Notes (Addendum)
[address]       [place] [address]             [phone]      1 Day Post-Op Procedure(s) (LRB): CORONARY ARTERY BYPASS GRAFTING times four using Right Greater Saphenous Vein Graft harvested endoscopically and Left Internal Mammary Artery (N/A) INTRAOPERATIVE TRANSESOPHAGEAL ECHOCARDIOGRAM (N/A)  Subjective:  Doing okay this morning, sleepy.  Pain is controlled  Objective: Vital signs in last 24 hours: Temp:  [96.1 F (35.6 C)-101.7 F (38.7 C)] 100 F (37.8 C) (08/21 0745) Pulse Rate:  [33-96] 70 (08/21 0745) Cardiac Rhythm:  [-] Ventricular paced;Normal sinus rhythm (08/20 2300) Resp:  [7-26] 17 (08/21 0745) BP: (81-137)/(51-106) 92/56 mmHg (08/21 0745) SpO2:  [95 %-100 %] 98 % (08/21 0745) Arterial Line BP: (79-154)/(48-103) 94/48 mmHg (08/21 0745) FiO2 (%):  [40 %-50 %] 40 % (08/21 0000) Weight:  [194 lb 7.1 oz (88.2 kg)] 194 lb 7.1 oz (88.2 kg) (08/21 0500)  Hemodynamic parameters for last 24 hours: PAP: (26-50)/(11-33) 27/11 mmHg CO:  [3.8 L/min-5.2 L/min] 5.2 L/min CI:  [2 L/min/m2-2.7 L/min/m2] 2.7 L/min/m2  Intake/Output from previous day: 08/20 0701 - 08/21 0700 In: 5877.2 [I.V.:3131.2; Blood:1036; NG/GT:60; IV Piggyback:1650] Out: 3760 [Urine:3250; Emesis/NG output:100; Chest Tube:410]  General appearance: alert, cooperative and no distress Heart: regular rate and rhythm Lungs: clear to auscultation bilaterally Abdomen: soft, non-tender; bowel sounds normal; no masses,  no organomegaly Extremities: edema trace Wound: EVH site clean and dry, dressing not removed from sternotomy  Lab Results:  Recent Labs  01/01/13 2200 01/01/13 2210 01/02/13 0341  WBC 11.9*  --  11.2*  HGB 11.6* 11.9* 10.8*  HCT 34.5* 35.0* 32.2*  PLT 122*  --  120*   BMET:  Recent Labs  01/01/13 0601  01/01/13 2210 01/02/13 0341  NA 143  < > 142 141  K 3.9  < > 4.6 4.2  CL 107  --  108 109  CO2 25  --   --  24  GLUCOSE 117*  < > 139* 102*    BUN 11  --  10 11  CREATININE 1.12  < > 1.20 1.20  CALCIUM 8.8  --   --  8.1*  < > = values in this interval not displayed.  PT/INR:  Recent Labs  01/01/13 1537  LABPROT 15.6*  INR 1.27   ABG    Component Value Date/Time   PHART 7.370 01/02/2013 0135   HCO3 23.4 01/02/2013 0135   TCO2 25 01/02/2013 0135   ACIDBASEDEF 1.0 01/02/2013 0135   O2SAT 97.0 01/02/2013 0135   CBG (last 3)   Recent Labs  01/01/13 2015 01/01/13 2111 01/01/13 2307  GLUCAP 118* 111* 127*    Assessment/Plan: S/P Procedure(s) (LRB): CORONARY ARTERY BYPASS GRAFTING times four using Right Greater Saphenous Vein Graft harvested endoscopically and Left Internal Mammary Artery (N/A) INTRAOPERATIVE TRANSESOPHAGEAL ECHOCARDIOGRAM (N/A)  1. CV- NSR, remains on Neo drip will wean as tolerated 2. Pulm- wean oxygen as tolerated, encouraged use of IS 3. Renal- mild elevation of creatinine at 1.20, lytes okay- volume overloaded will need diuresis once off drips 4. Expected Acute Blood Loss Anemia- mild Hgb 10.8 5. Low grade temp today- most likely SIRS 6. CBGs controlled- remains on insulin drip sugars are well controlled, patient not a diabetic will d/c insulin drip and continue SSIP 7. Dispo- POD #1 progression orders   LOS: 6 days    BARRETT, ERIN 01/02/2013  I have seen and  examined Nathan Buckley and agree with the above assessment  and plan.  Delight Ovens MD Beeper [phone] Office [phone] 01/02/2013 1:20 PM

## 2013-01-02 NOTE — Progress Notes (Addendum)
Patient refuses to get up OOB at this time; stating " I feel too dizzy."  No acute distress noted at this time. VSS.

## 2013-01-02 NOTE — Progress Notes (Signed)
[facility]  Subjective: Awake an alert. Endorses chest wall soreness but no other complaints.   Objective: Vital signs in last 24 hours: Temp:  [96.1 F (35.6 C)-101.7 F (38.7 C)] 99.5 F (37.5 C) (08/21 0900) Pulse Rate:  [33-96] 68 (08/21 0900) Resp:  [7-26] 16 (08/21 0900) BP: (81-137)/(51-106) 97/58 mmHg (08/21 0900) SpO2:  [95 %-100 %] 100 % (08/21 0900) Arterial Line BP: (79-154)/(48-103) 100/51 mmHg (08/21 0900) FiO2 (%):  [40 %-50 %] 40 % (08/21 0000) Weight:  [194 lb 7.1 oz (88.2 kg)] 194 lb 7.1 oz (88.2 kg) (08/21 0500) Last BM Date: 12/31/12  Intake/Output from previous day: 08/20 0701 - 08/21 0700 In: 5877.2 [I.V.:3131.2; Blood:1036; NG/GT:60; IV Piggyback:1650] Out: 3760 [Urine:3250; Emesis/NG output:100; Chest Tube:410] Intake/Output this shift: Total I/O In: 60 [I.V.:60] Out: 65 [Urine:60; Chest Tube:5]  Medications Current Facility-Administered Medications  Medication Dose Route Frequency Provider Last Rate Last Dose  . 0.45 % sodium chloride infusion   Intravenous Continuous Ardelle Balls, PA-C 20 mL/hr at 01/02/13 0700    . 0.9 %  sodium chloride infusion   Intravenous Continuous Ardelle Balls, PA-C 20 mL/hr at 01/01/13 1530 20 mL/hr at 01/01/13 1530  . 0.9 %  sodium chloride infusion  250 mL Intravenous Continuous Ardelle Balls, PA-C 1 mL/hr at 01/02/13 0640 250 mL at 01/02/13 0640  . acetaminophen (TYLENOL) tablet 1,000 mg  1,000 mg Oral Q6H Donielle Margaretann Loveless, PA-C   1,000 mg at 01/02/13 1610   Or  . acetaminophen (TYLENOL) solution 1,000 mg  1,000 mg Per Tube Q6H Ardelle Balls, PA-C   1,000 mg at 01/01/13 2310  . albumin human 5 % solution 250 mL  250 mL Intravenous Q15 min PRN Ardelle Balls, PA-C   250 mL at 01/01/13 1800  . aspirin EC tablet 325 mg  325 mg Oral Daily Donielle Margaretann Loveless, PA-C       Or  . aspirin chewable tablet 324 mg  324 mg Per Tube Daily Donielle Margaretann Loveless,  PA-C      . atorvastatin (LIPITOR) tablet 10 mg  10 mg Oral q1800 Wilburt Finlay, PA-C   10 mg at 12/31/12 1717  . bisacodyl (DULCOLAX) EC tablet 10 mg  10 mg Oral Daily Donielle Margaretann Loveless, PA-C       Or  . bisacodyl (DULCOLAX) suppository 10 mg  10 mg Rectal Daily Donielle Margaretann Loveless, PA-C      . cefUROXime (ZINACEF) 1.5 g in dextrose 5 % 50 mL IVPB  1.5 g Intravenous Q12H Ardelle Balls, PA-C   1.5 g at 01/02/13 9604  . dexmedetomidine (PRECEDEX) 200 MCG/50ML infusion  0.1-0.7 mcg/kg/hr Intravenous Continuous Ardelle Balls, PA-C 2.2 mL/hr at 01/01/13 2000 0.1 mcg/kg/hr at 01/01/13 2000  . docusate sodium (COLACE) capsule 200 mg  200 mg Oral Daily Donielle Margaretann Loveless, PA-C      . DOPamine (INTROPIN) 800 mg in dextrose 5 % 250 mL infusion  3 mcg/kg/min Intravenous Continuous Donielle Margaretann Loveless, PA-C      . famotidine (PEPCID) IVPB 20 mg  20 mg Intravenous Q12H Delight Ovens, MD   20 mg at 01/01/13 1642  . insulin aspart (novoLOG) injection 0-24 Units  0-24 Units Subcutaneous Q4H Erin Barrett, PA-C      . lactated ringers infusion   Intravenous Continuous Ardelle Balls, PA-C 20 mL/hr at 01/02/13 0900    . metoCLOPramide (REGLAN) injection 10 mg  10 mg Intravenous  Q6H Donielle Margaretann Loveless, PA-C   10 mg at 01/02/13 [phone]  . metoprolol (LOPRESSOR) injection 2.5-5 mg  2.5-5 mg Intravenous Q2H PRN Ardelle Balls, PA-C      . metoprolol tartrate (LOPRESSOR) tablet 12.5 mg  12.5 mg Oral BID Ardelle Balls, PA-C       Or  . metoprolol tartrate (LOPRESSOR) 25 mg/10 mL oral suspension 12.5 mg  12.5 mg Per Tube BID Ardelle Balls, PA-C      . midazolam (VERSED) injection 2 mg  2 mg Intravenous Q1H PRN Ardelle Balls, PA-C      . morphine 2 MG/ML injection 2-5 mg  2-5 mg Intravenous Q1H PRN Ardelle Balls, PA-C   2 mg at 01/02/13 0745  . nitroGLYCERIN 0.2 mg/mL in dextrose 5 % infusion  0-100 mcg/min Intravenous Continuous Ardelle Balls, PA-C 0  mL/hr at 01/01/13 1900 0 mcg/min at 01/01/13 1900  . ondansetron (ZOFRAN) injection 4 mg  4 mg Intravenous Q6H PRN Ardelle Balls, PA-C      . oxyCODONE (Oxy IR/ROXICODONE) immediate release tablet 5-10 mg  5-10 mg Oral Q3H PRN Ardelle Balls, PA-C      . [START ON 01/03/2013] pantoprazole (PROTONIX) EC tablet 40 mg  40 mg Oral Daily Donielle M Zimmerman, PA-C      . phenylephrine (NEO-SYNEPHRINE) 20,000 mcg in dextrose 5 % 250 mL infusion  0-100 mcg/min Intravenous Continuous Ardelle Balls, PA-C 26.3 mL/hr at 01/02/13 0900 35 mcg/min at 01/02/13 0900  . sodium chloride 0.9 % injection 3 mL  3 mL Intravenous Q12H Donielle M Zimmerman, PA-C      . sodium chloride 0.9 % injection 3 mL  3 mL Intravenous PRN Donielle Margaretann Loveless, PA-C        PE: General appearance: alert, cooperative and no distress Lungs: clear to auscultation bilaterally Heart: regular rate and rhythm and 2/6 murmur at left sternal border Extremities: trace bilateral LEE Pulses: 2+ and symmetric Skin: warm and dry Neurologic: Grossly normal  Lab Results:   Recent Labs  01/01/13 1537  01/01/13 2200 01/01/13 2210 01/02/13 0341  WBC 13.1*  --  11.9*  --  11.2*  HGB 11.8*  < > 11.6* 11.9* 10.8*  HCT 34.8*  < > 34.5* 35.0* 32.2*  PLT 103*  --  122*  --  120*  < > = values in this interval not displayed. BMET  Recent Labs  01/01/13 0601  01/01/13 1539 01/01/13 2200 01/01/13 2210 01/02/13 0341  NA 143  < > 141  --  142 141  K 3.9  < > 4.2  --  4.6 4.2  CL 107  --   --   --  108 109  CO2 25  --   --   --   --  24  GLUCOSE 117*  < > 127*  --  139* 102*  BUN 11  --   --   --  10 11  CREATININE 1.12  --   --  1.10 1.20 1.20  CALCIUM 8.8  --   --   --   --  8.1*  < > = values in this interval not displayed. PT/INR  Recent Labs  01/01/13 1537  LABPROT 15.6*  INR 1.27   Assessment/Plan  Principal Problem:   Unstable angina Active Problems:   Essential hypertension   Abnormal EKG    Coronary artery disease- noted on CT   Mitral valve regurgitation- moderate  Plan: Day 1  s/p CABG x 4 (LIMA-LAD, SVG-first diag, SVG-Circ, SVG-PDA). Hemodynamically stable on Dopamine and phenylephrine. Also on ASA, a BB and statin. Maintaining NSR on telemetry with no post-operative arrhthymias noted. Continue plan of care per TCTS. Will continue to follow along and provide recommendations as patient progresses.     LOS: 6 days    Brittainy M. Delmer Islam 01/02/2013 9:13 AM  I have seen and examined the patient along with Brittainy M. Sharol Harness, PA-C.  I have reviewed the chart, notes and new data.  I agree with 's note.  Still with postop hypotension, but otherwise good progress without CHF or arrhythmia. Will follow. Note he has had symptoms highly suggestive of gout recently and with major fluid shifts could have another attack.  Thurmon Fair, MD, [facility] [phone] 01/02/2013, 9:30 AM

## 2013-01-02 NOTE — Op Note (Signed)
NAMEDEMARIS, Nathan Buckley NO.:  [id number]  MEDICAL RECORD NO.:  [id number]  LOCATION:  [id number]                        FACILITY:  [facility]  PHYSICIAN:  Sheliah Plane, MD    DATE OF BIRTH:  [birth year]  DATE OF PROCEDURE:  01/01/2013 DATE OF DISCHARGE:                              OPERATIVE REPORT   PREOPERATIVE DIAGNOSIS:  Coronary occlusive disease with unstable angina.  POSTOPERATIVE DIAGNOSIS:  Coronary occlusive disease with unstable angina.  SURGICAL PROCEDURE:  Coronary artery bypass grafting x4 with the left internal mammary to the left anterior descending coronary artery, reverse saphenous vein graft to the first diagonal coronary artery, reverse saphenous vein graft to the distal circumflex coronary artery, reverse saphenous vein graft to the posterior descending coronary artery with right thigh and leg Endovein harvesting and TEE.  SURGEON:  Sheliah Plane, MD  FIRST ASSISTANT:  Doree Fudge, PA-C  BRIEF HISTORY:  The patient is a 52 year old male who presents with unstable anginal symptoms and underwent cardiac catheterization, which revealed severe three-vessel coronary artery disease with total LAD, high-grade distal circumflex lesion, a continuation branch of the circumflex supplying the posterolateral branch with collateral flow to a totally occluded right coronary artery.  In addition, the patient had been on TEE, mild mitral regurgitation with very slight prolapse of the anterior leaflet.  At the time of surgery TEE, this did not appear to be sufficient to warrant any intervention, so we proceeded with coronary bypass only.  The risks and options of bypass surgery and possible mitral valve repair and replacement were discussed prior to surgery with the patient in detail and he agreed and signed informed consent.  DESCRIPTION OF PROCEDURE:  With Swan-Ganz and arterial line monitors placed, the patient underwent general endotracheal  anesthesia without incident.  Skin of the chest and legs were prepped with Betadine and draped in the usual sterile manner.  A TEE probe was passed by Dr. Krista Blue and the findings are dictated under separate note.  Using [employer] Endovein harvesting system, the vein was harvested from the right thigh and calf and was of good quality and caliber.  Median sternotomy was performed.  Left internal mammary artery was dissected down as pedicle graft.  The distal artery was divided, had good free flow.  Pericardium was opened and overall ventricular function appeared preserved, particularly on the anterior wall with the LAD totally occluded.  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  The patient was systemically heparinized.  Ascending aorta was cannulated. The right atrium was cannulated and aortic root vent cardioplegia needle was introduced into the ascending aorta.  The patient was placed on cardiopulmonary bypass 2.4 liters/minute/meter squared.  Sites of anastomosis were selected and dissected out of the epicardium.  The patient's body temperature was cooled to 32 degrees.  Aortic crossclamp was applied 500 mL cold blood.  Potassium cardioplegia was administered with diastolic arrest of the heart.  Myocardial septal temperature was monitored throughout the crossclamp.  Attention was turned first to the posterior descending coronary artery which was opened and thickened vessel diffusely diseased, but with a good caliber admitted a 1.5 mm probe distally.  Using a running 7-0 Prolene, distal anastomosis was performed with a segment of reverse saphenous vein graft  to the posterior descending coronary artery.  The heart was then elevated and the distal circumflex was opened, was 1.5 mm in size.  Using a running 7- 0 Prolene, distal anastomosis was performed with second reverse saphenous vein graft.  Additional cold blood cardioplegia was administered down the vein  grafts.  The first diagonal vessel was then opened, was very diffusely diseased, 1.2 mm in size.  Using a running 7- 0 Prolene, distal anastomosis was performed.  The left internal mammary artery was then prepared and the LAD was opened between the mid and distal third.  The vessel was very diffusely diseased, somewhat small, but did admit a 1.5 mm probe distally.  Using a running 8-0 Prolene, left internal mammary artery was anastomosed to the left anterior descending coronary artery.  With crossclamp still in place, 3 punch aortotomies were performed.  The vein grafts were trimmed to appropriate length, anastomosed to the ascending aorta.  Air was evacuated from the grafts.  The bulldog was removed from the mammary artery with prompt rise in myocardial septal temperature.  The proximal anastomoses were then completed after de-airing and the aortic cross-clamp was removed with total crossclamp time of 106 minutes.  The patient spontaneously converted to a sinus rhythm.  Atrial and ventricular pacing wires were applied.  The patient's body temperature rewarmed to 37 degrees.  He was then ventilated and weaned from cardiopulmonary bypass without difficulty.  He remained hemodynamically stable.  TEE showed good function of the left ventricle.  The  mitral regurgitation was again unchanged and mild.  Total pump time was 138 minutes.  Protamine sulfate was administered without operative field hemostatic.  The pericardium was reapproximated.  A  left pleural tube and a Blake mediastinal drain were left in place.  Sternum was closed with #6 stainless steel wire. Fascia was closed with interrupted 0 Vicryl, running 3-0 Vicryl subcutaneous tissue, and 4-0 subcuticular stitch in skin edges.  Dry dressings were applied.  Sponge and needle count was reported as correct at the completion of the procedure.  The patient tolerated the procedure without obvious complication and was transferred to Surgical  Intensive Care Unit for further postoperative care.     Sheliah Plane, MD     EG/MEDQ  D:  01/02/2013  T:  01/02/2013  Job:  [id number]

## 2013-01-02 NOTE — Procedures (Addendum)
Extubation Procedure Note  Patient Details:   Name: Kemet Nijjar DOB: 1960/06/25 MRN: [id number]   Airway Documentation:     Evaluation  O2 sats: stable throughout Complications: No apparent complications Patient did tolerate procedure well. Bilateral Breath Sounds: Clear Suctioning: Airway Yes  Patient extubated per protocol to 4LNC.  Patient had good cuff leak and followed all commands prior to extubation.  All weaning parameters met prior to extubation.  Pt received deep oral suction and ETT suction prior to extubation.  No stridor noted post-extubation.  Pt vocalizing well and tolerating oxygen therapy well, O2 sat. 98%.  RT will monitor patient status.  BBS equal, clear and diminished.  NIF -40 cmH2O VC .900L  Malachi Paradise 01/02/2013, 12:55 AM

## 2013-01-03 ENCOUNTER — Encounter (HOSPITAL_COMMUNITY): Payer: Self-pay | Admitting: Cardiothoracic Surgery

## 2013-01-03 ENCOUNTER — Inpatient Hospital Stay (HOSPITAL_COMMUNITY): Payer: PRIVATE HEALTH INSURANCE

## 2013-01-03 DIAGNOSIS — Z951 Presence of aortocoronary bypass graft: Secondary | ICD-10-CM

## 2013-01-03 LAB — GLUCOSE, CAPILLARY
Glucose-Capillary: 109 mg/dL — ABNORMAL HIGH (ref 70–99)
Glucose-Capillary: 120 mg/dL — ABNORMAL HIGH (ref 70–99)

## 2013-01-03 LAB — BASIC METABOLIC PANEL
BUN: 13 mg/dL (ref 6–23)
CO2: 25 mEq/L (ref 19–32)
Calcium: 8.1 mg/dL — ABNORMAL LOW (ref 8.4–10.5)
Creatinine, Ser: 1.1 mg/dL (ref 0.50–1.35)
GFR calc non Af Amer: 75 mL/min — ABNORMAL LOW (ref >90)
Glucose, Bld: 134 mg/dL — ABNORMAL HIGH (ref 70–99)
Sodium: 135 mEq/L (ref 135–145)

## 2013-01-03 LAB — CBC
MCH: 24.6 pg — ABNORMAL LOW (ref 26.0–34.0)
MCHC: 33.2 g/dL (ref 30.0–36.0)
MCV: 74.2 fL — ABNORMAL LOW (ref 78.0–100.0)
Platelets: 125 10*3/uL — ABNORMAL LOW (ref 150–400)
RBC: 4.14 MIL/uL — ABNORMAL LOW (ref 4.22–5.81)

## 2013-01-03 MED ORDER — MOVING RIGHT ALONG BOOK
Freq: Once | Status: AC
  Administered 2013-01-03: 09:00:00
  Filled 2013-01-03: qty 1

## 2013-01-03 MED ORDER — SODIUM CHLORIDE 0.9 % IV SOLN: 250.0000 mL | INTRAVENOUS | Status: DC | PRN

## 2013-01-03 MED ORDER — SODIUM CHLORIDE 0.9 % IJ SOLN: 3.0000 mL | INTRAMUSCULAR | Status: DC | PRN

## 2013-01-03 MED ORDER — SODIUM CHLORIDE 0.9 % IJ SOLN
3.0000 mL | Freq: Two times a day (BID) | INTRAMUSCULAR | Status: DC
  Administered 2013-01-03 – 2013-01-04 (×3): 3 mL via INTRAVENOUS

## 2013-01-03 MED ORDER — ENOXAPARIN SODIUM 30 MG/0.3ML ~~LOC~~ SOLN
30.0000 mg | SUBCUTANEOUS | Status: DC
  Administered 2013-01-03 – 2013-01-05 (×3): 30 mg via SUBCUTANEOUS
  Filled 2013-01-03 (×3): qty 0.3

## 2013-01-03 NOTE — Progress Notes (Signed)
CARDIAC REHAB PHASE I   PRE:  Rate/Rhythm: 89 SR    BP: sitting 110/64    SaO2: 97 3L  MODE:  Ambulation: 300 ft   POST:  Rate/Rhythm: 102 ST    BP: sitting 122/70     SaO2: 94 2L  Pt c/o sternal pain. Received meds 15 min ago. Able to stand and walk without much assistance, just verbal cues of mechanics. Somewhat anxious before walk but more relaxed afterward. Return to recliner, applied 2L instead of 3L. Encouraged third walk this pm. 1323-1400   Elissa Lovett [place] CES, ACSM 01/03/2013 1:56 PM

## 2013-01-03 NOTE — Progress Notes (Addendum)
      [address]       [name] [address]             [phone]      2 Days Post-Op Procedure(s) (LRB): CORONARY ARTERY BYPASS GRAFTING times four using Right Greater Saphenous Vein Graft harvested endoscopically and Left Internal Mammary Artery (N/A) INTRAOPERATIVE TRANSESOPHAGEAL ECHOCARDIOGRAM (N/A)  Subjective:  The patient is doing well this morning.  He does have some incisional soreness and shortness of breath.  He is ambulating, No BM  Objective: Vital signs in last 24 hours: Temp:  [97.4 F (36.3 C)-99.7 F (37.6 C)] 97.4 F (36.3 C) (08/22 0731) Pulse Rate:  [67-82] 77 (08/22 0700) Cardiac Rhythm:  [-] Normal sinus rhythm (08/22 0700) Resp:  [0-29] 11 (08/22 0700) BP: (92-124)/(55-76) 113/57 mmHg (08/22 0700) SpO2:  [94 %-100 %] 95 % (08/22 0700) Arterial Line BP: (100-153)/(51-92) 111/92 mmHg (08/21 1600) Weight:  [193 lb 9 oz (87.8 kg)] 193 lb 9 oz (87.8 kg) (08/22 0500)  Hemodynamic parameters for last 24 hours: PAP: (29-49)/(12-27) 49/27 mmHg CO:  [3.5 L/min] 3.5 L/min CI:  [1.9 L/min/m2] 1.9 L/min/m2  Intake/Output from previous day: 08/21 0701 - 08/22 0700 In: 1430 [P.O.:840; I.V.:440; IV Piggyback:150] Out: 1015 [Urine:1000; Chest Tube:15]  General appearance: alert, cooperative and no distress Heart: regular rate and rhythm Lungs: clear to auscultation bilaterally Abdomen: soft, non-tender; bowel sounds normal; no masses,  no organomegaly Extremities: edema trace Wound: clean and dry  Lab Results:  Recent Labs  01/02/13 1600 01/02/13 1608 01/03/13 0420  WBC 14.2*  --  11.8*  HGB 11.4* 10.9* 10.2*  HCT 34.0* 32.0* 30.7*  PLT 129*  --  125*   BMET:  Recent Labs  01/02/13 0341  01/02/13 1608 01/03/13 0420  NA 141  --  140 135  K 4.2  --  4.4 4.2  CL 109  --  108 102  CO2 24  --   --  25  GLUCOSE 102*  --  142* 134*  BUN 11  --  13 13  CREATININE 1.20  < > 1.30 1.10  CALCIUM 8.1*  --   --  8.1*  < > = values  in this interval not displayed.  PT/INR:  Recent Labs  01/01/13 1537  LABPROT 15.6*  INR 1.27   ABG    Component Value Date/Time   PHART 7.370 01/02/2013 0135   HCO3 23.4 01/02/2013 0135   TCO2 21 01/02/2013 1608   ACIDBASEDEF 1.0 01/02/2013 0135   O2SAT 97.0 01/02/2013 0135   CBG (last 3)   Recent Labs  01/02/13 2323 01/03/13 0346 01/03/13 0733  GLUCAP 109* 116* 120*    Assessment/Plan: S/P Procedure(s) (LRB): CORONARY ARTERY BYPASS GRAFTING times four using Right Greater Saphenous Vein Graft harvested endoscopically and Left Internal Mammary Artery (N/A) INTRAOPERATIVE TRANSESOPHAGEAL ECHOCARDIOGRAM (N/A)  1. CV- NSR good rate and pressure control- continue Lopressor 2. Pulm- wean oxygen as tolerated, encouraged use of IS 3. Renal- creatinine stable, mildly volume overloaded continue Lasix 4. Expected acute Blood Loss Anemia- stable 5. CBGs controlled- will d/c glucose checks 6. Dispo- patient stable, d/c foley, transfer to 2000   LOS: 7 days    Lowella Dandy 01/03/2013  Doing well, ambulated well  To 2000 I have seen and examined Nathan Buckley and agree with the above assessment  and plan.  Delight Ovens MD [facility] [phone] Office [phone] 01/03/2013 9:28 AM

## 2013-01-03 NOTE — Discharge Summary (Signed)
Physician Discharge Summary       [address] [place]       [name] [address]             [phone]    Patient ID: Nathan Buckley MRN: [id number] DOB/AGE: 54-May-1962 54 y.o.  Admit date: 12/27/2012 Discharge date: 01/05/2013  Admission Diagnoses: 1. ACS and multivessel CAD 2.Mild mitral regurgitation 3.History of tobacco abuse 4.History of hypertension  Discharge Diagnoses:  1. ACS and multivessel CAD 2.Mild mitral regurgitation 3.History of tobacco abuse 4.History of hypertension 5.Mild ABL anemia 6.Mild thrombocytopenia  Procedure (s):  1.Cardiac catheterization done by Dr. Herbie Baltimore on 12/13/2012: Left Ventriculography: Not performed  EF: 55-60 % via echocardiogram Coronary Anatomy:  Left Main: Very large caliber vessel that trifurcates into the LAD, Ramus Intermedius, Circumflex; angiographically normal LAD: The vessel begins as a very small caliber diffusely diseased vessel roughly 70% stenosis from what the size should be; it gives off a very proximal diagonal/optional diagonal branch that is 100% occluded proximally and fills via left to left collaterals. Beyond this, the LAD continues to give off a small caliber septal trunk and then essentially tapers to occlusion with faint distal flow that appears to be via collaterals. Also noted distally is competitive flow, presumably from right to left collaterals.  D1: At least a moderate caliber vessel, and it appeared to be proximally occluded. This fills via left to left collaterals, and covers a large portion of the anterolateral wall  D2: Small-caliber vessel that is occluded right at the occlusion site of the LAD, this also fills very faintly from collateral vessels. (From right coronary angiography this appears to be filled via an RV marginal branch collateral) Left Circumflex: Large-caliber vessel that gives off a moderate size atrial branch, and actually appears to provide collateral flow to the  right posterior lateral vessel; there are 2 small obtuse marginal branches before the vessel bifurcates in the AV groove into an OM 1 and small left posterolateral (LPL) system. The LPL system bifurcates and is to small branches, most distal branch has a roughly 80% stenosis.  Lateral OM: Moderate caliber vessel with 2 tandem 90% lesions with diffuse abnormality in the intervening segment. Beyond this the vessel is relatively free of significant disease it courses along the inferolateral wall. Ramus intermedius: Moderate large-caliber vessel, it is relatively free of disease until roughly 2/3 the way down, where it is 100 % occluded at a small branch vessel  RCA: Large-caliber dominant vessel with diffuse mild proximal disease into an ectatic segment where it major, moderate caliber RV marginal branch takes off. Beyond this section, there is a large ectatic, diffusely irregular /potentially thrombotic segment with 95-99% (subtotal) occlusion. However there is TIMI 3 flow distally beyond this lesion in a large caliber vessel that bifurcates into the right posterior lateral branch and right posterior descending artery.  RV marginal branch: This is actually a moderate caliber vessel that radiates distally with the anterior branch providing right to left collaterals to the proximal segment of the LAD/diagonal lesion. It also provides right to right collaterals to the posterior lateral branch. RPDA: Moderate caliber vessel with extensive septal perforator and distal vessel collaterals to the LAD, and fills the LAD almost to the proximal/mid occlusion site. The vessel does have an ostial with 60-70% stenosis.  RPL Sysytem:The RPAV is a large caliber vessel that terminates as a very large right posterior lateral branch that covers a major portion of the infero/postero-lateral wall. This vessel was also perfused from  left to right collaterals. After it gives off the AV nodal artery, there is a 80-90% stenosis that is  distal to the left to right collateral insertion site.  2.Coronary artery bypass grafting x4 with the left  internal mammary to the left anterior descending coronary artery,  reverse saphenous vein graft to the first diagonal coronary artery,  reverse saphenous vein graft to the distal circumflex coronary artery,  reverse saphenous vein graft to the posterior descending coronary artery  with right thigh and leg Endovein harvesting and TEE by Dr. Tyrone Sage on 01/02/2013.    History of Presenting Illness: The patient is a 52 year old male from [place] for approximately the last 2 months has been having episodes of exertional chest pain. The pain typically lasts from 10-15 minutes and is relieved with rest. The pain does radiate to his back. He has noted increasing frequency and severity of the episodes. He presented to his primary care physician on 11/26/2012. An EKG done at that time showed some T wave abnormalities. He was started on omeprazole as there was felt to be some GI component to his pain. He was referred to [facility] heart and vascular for cardiology evaluation and they felt as though he had unstable angina and was admitted for further evaluation and treatment. He is a former smoker from approximately age 18 having quit in 2000. The chest pain episodes did have a component of shortness of breath and he is also experiencing orthopnea. He denies PND or peripheral edema. On the day of admission, his EKG showed Q waves in the inferior leads. A CT scan was negative for dissection. He was placed on PPI, intravenous heparin, nitroglycerin, aspirin, and beta blocker. A cardiothoracic consultation was obtained with Dr. Tyrone Sage for the consideration of coronary artery bypass grafting surgery. Potential risks, benefits, and complications were discussed with the patient and he agreed to proceed. Pre operative carotid duplex US showed no significant carotid artery stenosis bilaterally. He underwent a CABG x 4  on 01/01/2013.   Brief Hospital Course:  The patient was extubated early the morning of post operative day one  without difficulty. He remained afebrile and hemodynamically stable. Theone Murdoch, a line, chest tubes, and foley were removed early in the post operative course. Lopressor was started and titrated accordingly. He was volume over loaded and diuresed. He/she was weaned off the insulin drip.The patient's HGA1C pre op was 6.3. He will need further surveillance as an outpatient. He did have mild thrombocytopenia post op. His platelets went as low as 120,000. They are now up to 125,000. He also had ABL anemia. His last H and H was 10.2 and 30.7. He did not require a transfusion.The patient was felt surgically stable for transfer from the ICU to PCTU for further convalescence on 01/03/2013. He continues to progress with cardiac rehab. He was ambulating on room air. He has been tolerating a diet and has had a bowel movement. As discussed with Dr. Herbie Baltimore, will start low dose Lisinopril. Dr. Herbie Baltimore will decide if start Plavix or Brilinta (per CURE trial) as outpatient.Epicardial pacing wires and chest tube sutures will be removed prior to discharge. Provided the patient remains afebrile, hemodynamically stable, and pending morning round evaluation, He/she will be surgically stable for discharge on 01/06/2013.    Latest Vital Signs: Blood pressure 131/78, pulse 78, temperature 98.9 F (37.2 C), temperature source Oral, resp. rate 18, height 5\' 3"  (1.6 m), weight 86.41 kg (190 lb 8 oz), SpO2 94.00%.  Physical Exam: General appearance: alert,  cooperative and no distress  Heart: regular rate and rhythm  Lungs: clear to auscultation bilaterally  Abdomen: soft, non-tender; bowel sounds normal; no masses, no organomegaly  Extremities: edema trace  Wound: clean and dry   Discharge Condition:Stable  Recent laboratory studies:  Lab Results  Component Value Date   WBC 9.7 01/04/2013   HGB 10.0*  01/04/2013   HCT 30.0* 01/04/2013   MCV 73.5* 01/04/2013   PLT 153 01/04/2013   Lab Results  Component Value Date   NA 139 01/04/2013   K 3.9 01/04/2013   CL 105 01/04/2013   CO2 27 01/04/2013   CREATININE 1.07 01/04/2013   GLUCOSE 139* 01/04/2013      Diagnostic Studies:    Ct Angio Abdomen W/cm &/or Wo Contrast  12/27/2012   *RADIOLOGY REPORT*  Clinical Data:  Abdominal pain radiating into chest and back, evaluate for dissection  CT ANGIOGRAPHY CHEST, ABDOMEN AND PELVIS  Technique:  Multidetector CT imaging through the chest, abdomen and pelvis was performed using the standard protocol during bolus administration of intravenous contrast.  Multiplanar reconstructed images including MIPs were obtained and reviewed to evaluate the vascular anatomy.  Contrast: OMNIPAQUE IOHEXOL 350 MG/ML SOLN  Comparison:   Chest x-ray 12/27/2012; prior CT abdomen/pelvis 04/22/2008  CTA CHEST  Findings:  Mediastinum: Nonspecific heterogeneous appearance of thyroid gland. Probable 12 mm nodule in the left thyroid gland is nonspecific by CT.  Mild circumferential thickening of the distal esophageal wall. No suspicious mediastinal or hilar adenopathy.  No mediastinal mass.  Heart/Vascular: Conventional three-vessel arch anatomy.  No acute intramural hematoma, dissection or evidence of aneurysmal dilatation.  Evaluation of the coronary arteries in cardiac structures slightly limited by noncardiac gated technique.  There is atherosclerotic calcification along the course of the left anterior descending coronary artery.  The heart is at the upper limits of normal for size. Focal hypoattenuation of the myocardium at the inferior ventricular apex with associated thinning suggest remodelling from prior myocardial infarction.  No pericardial effusion. Unremarkable pulmonary artery.  No central embolus.  Lungs/Pleura: Mild dependent atelectasis in the lower lobes.  3 mm nonspecific nodule in the right middle lobe.  A 2.5 mm nodules  identified in the central anterior right lower lobe.  The presence of small multi focal pulmonary nodules is highly likely the sequelae of prior infection/inflammation/granulomatous process in the absence of a known primary malignancy.  Mild bilateral lower lobe bronchial wall thickening.  Bones: No acute fracture or aggressive appearing lytic or blastic osseous lesion.   Review of the MIP images confirms the above findings.  IMPRESSION:  1.  Negative for acute aortic pathology or aneurysmal dilatation.  2.  Atherosclerosis including coronary artery disease.  3.  Borderline cardiomegaly with sequelae suggesting prior remote myocardial infarction involving the apex and apical inferior wall.  4.  Mild circumferential thickening of the distal esophageal wall. Recommend clinical correlation for signs and symptoms of gastroesophageal reflux disease.  5.  Probable 12 mm left thyroid nodule is nonspecific by CT. Consider non emergent evaluation with dedicated thyroid ultrasound.  6. Mild bilateral lower lobe bronchial wall thickening as can be seen in both acute and chronic bronchitis.  7.  Several small (2 - 3 mm) pulmonary nodules.  In the absence of a known primary malignancy, these are highly likely the sequela of a prior infectious/inflammatory or granulomatous process.  CTA ABDOMEN AND PELVIS  Findings:  VASCULAR  Aorta: No evidence of acute intramural hematoma, aneurysmal dilatation or dissection.  Minimal  atherosclerotic vascular disease in the distal aorta and aortic bifurcation.  Celiac: Widely patent.  Conventional hepatic arterial anatomy.  SMA: Widely patent.  Renals: Two right and one left renal arteries are widely patent. No evidence of changes of fibromuscular dysplasia.  IMA: There may be mild narrowing at the origin.  The more distal vessel is widely patent.  Inflow: Trace atherosclerotic vascular disease without aneurysmal dilatation or stenosis in the bilateral common iliac arteries.  Veins: Given  arterial phase timing evaluation is limited.  No focal abnormality identified.  NON-VASCULAR  Abdomen: Unremarkable CT appearance of the stomach, duodenum, spleen, adrenal glands and pancreas.  Normal hepatic morphology and contour.  No discrete hepatic lesion.  Gallbladder is unremarkable. No intra or extrahepatic biliary ductal dilatation.  Symmetric parenchymal enhancement of the kidneys.  No hydronephrosis or nephrolithiasis.  No enhancing renal mass.  Tiny sub centimeter bilateral hypoattenuating lesions are too small to characterize but statistically likely benign cysts.  Normal-caliber large and small bowel.  No evidence of bowel obstruction.  Normal appendix identified in the right lower quadrant.  No free fluid or suspicious adenopathy.  Bones: No acute fracture or aggressive appearing lytic or blastic osseous lesion. .  Review of the MIP images confirms the above findings.  IMPRESSION:  1.  No acute aortic pathology or aneurysmal dilatation. 2.  No acute abnormality in the abdomen or pelvis to explain the patient's clinical symptoms. 3.  Mild atherosclerotic vascular disease without significant stenosis.  Signed,  Sterling Big, MD Vascular & Interventional Radiologist [facility] Radiology   Original Report Authenticated By: Malachy Moan, M.D.   Dg Chest Port 1 View  01/03/2013   CLINICAL DATA:  Cardiac surgery, postoperative  EXAM: PORTABLE CHEST - 1 VIEW  COMPARISON:  01/02/2013  FINDINGS: Swan-Ganz catheter removed with a right IJ introducer sheath remaining in place. Left chest tube removed.  Persistent mild atelectasis in the lingula and right lung base. Mild cardiomegaly noted without edema.  IMPRESSION: 1. Bili remainder of the support apparatuses the right IJ introducer sheath. 2. Stable mild bibasilar atelectasis. 3. Cardiomegaly.   Electronically Signed   By: Herbie Baltimore   On: 01/03/2013 07:52    Dg Chest Portable 1 View  01/01/2013   CLINICAL DATA:  Postop CABG.  EXAM:  PORTABLE CHEST - 1 VIEW  COMPARISON:  Chest radiographs and CT 12/27/2012.  FINDINGS: 1620 hr. Interval median sternotomy and CABG. Endotracheal tube tip is 2.1 cm above the carinal. A nasogastric tube projects below the diaphragm. Right IJ Swan-Ganz catheter tip is in the main pulmonary artery. Left chest tube is in place.  There are low lung volumes with bibasilar atelectasis, mild edema and small bilateral pleural effusions. No pneumothorax is demonstrated.  IMPRESSION: Small pleural effusions and bibasilar atelectasis following CABG. No pneumothorax.   Electronically Signed   By: Roxy Horseman   On: 01/01/2013 16:32   Ct Angio Chest Aorta W/cm &/or Wo/cm  12/27/2012   *RADIOLOGY REPORT*  Clinical Data:  Abdominal pain radiating into chest and back, evaluate for dissection  CT ANGIOGRAPHY CHEST, ABDOMEN AND PELVIS  Technique:  Multidetector CT imaging through the chest, abdomen and pelvis was performed using the standard protocol during bolus administration of intravenous contrast.  Multiplanar reconstructed images including MIPs were obtained and reviewed to evaluate the vascular anatomy.  Contrast: OMNIPAQUE IOHEXOL 350 MG/ML SOLN  Comparison:   Chest x-ray 12/27/2012; prior CT abdomen/pelvis 04/22/2008  CTA CHEST  Findings:  Mediastinum: Nonspecific heterogeneous appearance of  thyroid gland. Probable 12 mm nodule in the left thyroid gland is nonspecific by CT.  Mild circumferential thickening of the distal esophageal wall. No suspicious mediastinal or hilar adenopathy.  No mediastinal mass.  Heart/Vascular: Conventional three-vessel arch anatomy.  No acute intramural hematoma, dissection or evidence of aneurysmal dilatation.  Evaluation of the coronary arteries in cardiac structures slightly limited by noncardiac gated technique.  There is atherosclerotic calcification along the course of the left anterior descending coronary artery.  The heart is at the upper limits of normal for size. Focal  hypoattenuation of the myocardium at the inferior ventricular apex with associated thinning suggest remodelling from prior myocardial infarction.  No pericardial effusion. Unremarkable pulmonary artery.  No central embolus.  Lungs/Pleura: Mild dependent atelectasis in the lower lobes.  3 mm nonspecific nodule in the right middle lobe.  A 2.5 mm nodules identified in the central anterior right lower lobe.  The presence of small multi focal pulmonary nodules is highly likely the sequelae of prior infection/inflammation/granulomatous process in the absence of a known primary malignancy.  Mild bilateral lower lobe bronchial wall thickening.  Bones: No acute fracture or aggressive appearing lytic or blastic osseous lesion.   Review of the MIP images confirms the above findings.  IMPRESSION:  1.  Negative for acute aortic pathology or aneurysmal dilatation.  2.  Atherosclerosis including coronary artery disease.  3.  Borderline cardiomegaly with sequelae suggesting prior remote myocardial infarction involving the apex and apical inferior wall.  4.  Mild circumferential thickening of the distal esophageal wall. Recommend clinical correlation for signs and symptoms of gastroesophageal reflux disease.  5.  Probable 12 mm left thyroid nodule is nonspecific by CT. Consider non emergent evaluation with dedicated thyroid ultrasound.  6. Mild bilateral lower lobe bronchial wall thickening as can be seen in both acute and chronic bronchitis.  7.  Several small (2 - 3 mm) pulmonary nodules.  In the absence of a known primary malignancy, these are highly likely the sequela of a prior infectious/inflammatory or granulomatous process.  CTA ABDOMEN AND PELVIS  Findings:  VASCULAR  Aorta: No evidence of acute intramural hematoma, aneurysmal dilatation or dissection.  Minimal atherosclerotic vascular disease in the distal aorta and aortic bifurcation.  Celiac: Widely patent.  Conventional hepatic arterial anatomy.  SMA: Widely patent.   Renals: Two right and one left renal arteries are widely patent. No evidence of changes of fibromuscular dysplasia.  IMA: There may be mild narrowing at the origin.  The more distal vessel is widely patent.  Inflow: Trace atherosclerotic vascular disease without aneurysmal dilatation or stenosis in the bilateral common iliac arteries.  Veins: Given arterial phase timing evaluation is limited.  No focal abnormality identified.  NON-VASCULAR  Abdomen: Unremarkable CT appearance of the stomach, duodenum, spleen, adrenal glands and pancreas.  Normal hepatic morphology and contour.  No discrete hepatic lesion.  Gallbladder is unremarkable. No intra or extrahepatic biliary ductal dilatation.  Symmetric parenchymal enhancement of the kidneys.  No hydronephrosis or nephrolithiasis.  No enhancing renal mass.  Tiny sub centimeter bilateral hypoattenuating lesions are too small to characterize but statistically likely benign cysts.  Normal-caliber large and small bowel.  No evidence of bowel obstruction.  Normal appendix identified in the right lower quadrant.  No free fluid or suspicious adenopathy.  Bones: No acute fracture or aggressive appearing lytic or blastic osseous lesion. .  Review of the MIP images confirms the above findings.  IMPRESSION:  1.  No acute aortic pathology or aneurysmal dilatation.  2.  No acute abnormality in the abdomen or pelvis to explain the patient's clinical symptoms. 3.  Mild atherosclerotic vascular disease without significant stenosis.  Signed,  Sterling Big, MD Vascular & Interventional Radiologist [facility] Radiology   Original Report Authenticated By: Malachy Moan, M.D.        Future Appointments Provider Department Dept Phone   01/29/2013 9:30 AM Beverley Fiedler, MD [facility] [phone]   02/06/2013 12:15 PM Delight Ovens, MD [facility] [phone]      Discharge Medications:   Medication List      STOP taking these medications       aspirin 81 MG tablet  Replaced by:  aspirin 325 MG EC tablet     lisinopril-hydrochlorothiazide 10-12.5 MG per tablet  Commonly known as:  PRINZIDE,ZESTORETIC      TAKE these medications       aspirin 325 MG EC tablet  Take 1 tablet (325 mg total) by mouth daily.     atorvastatin 10 MG tablet  Commonly known as:  LIPITOR  Take 1 tablet (10 mg total) by mouth daily at 6 PM.     furosemide 40 MG tablet  Commonly known as:  LASIX  Take 1 tablet (40 mg total) by mouth daily. For 4 days then stop.     lisinopril 2.5 MG tablet  Commonly known as:  PRINIVIL,ZESTRIL  Take 1 tablet (2.5 mg total) by mouth daily.     metoprolol tartrate 25 MG tablet  Commonly known as:  LOPRESSOR  Take 1 tablet (25 mg total) by mouth 2 (two) times daily.     omeprazole 40 MG capsule  Commonly known as:  PRILOSEC  Take 1 capsule (40 mg total) by mouth daily.     oxyCODONE 5 MG immediate release tablet  Commonly known as:  Oxy IR/ROXICODONE  Take 1-2 tablets (5-10 mg total) by mouth every 4 (four) hours as needed for pain.     potassium chloride SA 20 MEQ tablet  Commonly known as:  K-DUR,KLOR-CON  Take 1 tablet (20 mEq total) by mouth daily. For 4 days then stop.     sucralfate 1 GM/10ML suspension  Commonly known as:  CARAFATE  Take 10 mL (1 g total) by mouth 4 (four) times daily -  with meals and at bedtime.        The patient has been discharged on:   1.Beta Blocker:  Yes [ x  ]                              No   [   ]                              If No, reason:  2.Ace Inhibitor/ARB: Yes [  x ]                                     No  [   ]                                     If No, reason:   3.Statin:   Yes [x   ]  No  [   ]                  If No, reason:  4.Ecasa:  Yes  [  x ]                  No   [   ]                  If No, reason:  Follow Up Appointments: Follow-up Information   Follow up with Thurmon Fair,  MD. (Call for an appointment for 2 weeks)    Specialty:  Cardiology   Contact information:   [address] [place] [address] [phone]       Follow up with GERHARDT,EDWARD B, MD. (PA/LAT CXR to be taken (at [facility] which is in the same building as Dr. Dennie Maizes office) on 02/06/2013 at 11:15pm;Appointment is on 02/06/2013 at 12:15 pm)    Specialty:  Cardiothoracic Surgery   Contact information:   [address] [place] [address] [phone]       Follow up with Medical Doctor. (Obtain a primary care physician to monitor HGA1C 6.3)       Signed: ZIMMERMAN,DONIELLE MPA-C 01/05/2013, 11:12 AM

## 2013-01-03 NOTE — Plan of Care (Signed)
Problem: Phase I Progression Outcomes Goal: Initial discharge plan identified Outcome: Completed/Met Date Met:  01/03/13 Home with wife  Problem: Phase III Progression Outcomes Goal: Time patient transferred to PCTU/Telemetry POD Outcome: Completed/Met Date Met:  01/03/13 Transferred to 9G29B via wheelchair with propak and O2.  Tolerated transfer well.  Ambulated to chair.  Vitals stable.

## 2013-01-03 NOTE — Progress Notes (Signed)
[facility]  Subjective: Doing well. No complaints.   Objective: Vital signs in last 24 hours: Temp:  [97.4 F (36.3 C)-99.7 F (37.6 C)] 97.4 F (36.3 C) (08/22 0731) Pulse Rate:  [71-82] 77 (08/22 0700) Resp:  [0-29] 11 (08/22 0700) BP: (94-124)/(55-76) 113/57 mmHg (08/22 0700) SpO2:  [94 %-100 %] 95 % (08/22 0700) Arterial Line BP: (111-153)/(60-92) 111/92 mmHg (08/21 1600) Weight:  [193 lb 9 oz (87.8 kg)] 193 lb 9 oz (87.8 kg) (08/22 0500) Last BM Date: 12/31/12  Intake/Output from previous day: 08/21 0701 - 08/22 0700 In: 1430 [P.O.:840; I.V.:440; IV Piggyback:150] Out: 1015 [Urine:1000; Chest Tube:15] Intake/Output this shift:    Medications Current Facility-Administered Medications  Medication Dose Route Frequency Provider Last Rate Last Dose  . 0.45 % sodium chloride infusion   Intravenous Continuous Ardelle Balls, PA-C 20 mL/hr at 01/02/13 0700    . 0.9 %  sodium chloride infusion  250 mL Intravenous Continuous Ardelle Balls, PA-C 1 mL/hr at 01/02/13 0640 250 mL at 01/02/13 0640  . 0.9 %  sodium chloride infusion  250 mL Intravenous PRN Erin Barrett, PA-C      . acetaminophen (TYLENOL) tablet 1,000 mg  1,000 mg Oral Q6H Donielle Margaretann Loveless, PA-C   1,000 mg at 01/03/13 1610   Or  . acetaminophen (TYLENOL) solution 1,000 mg  1,000 mg Per Tube Q6H Ardelle Balls, PA-C   1,000 mg at 01/01/13 2310  . aspirin EC tablet 325 mg  325 mg Oral Daily Donielle Margaretann Loveless, PA-C   325 mg at 01/02/13 9604   Or  . aspirin chewable tablet 324 mg  324 mg Per Tube Daily Donielle Margaretann Loveless, PA-C      . atorvastatin (LIPITOR) tablet 10 mg  10 mg Oral q1800 Wilburt Finlay, PA-C   10 mg at 01/02/13 1746  . bisacodyl (DULCOLAX) EC tablet 10 mg  10 mg Oral Daily Donielle Margaretann Loveless, PA-C       Or  . bisacodyl (DULCOLAX) suppository 10 mg  10 mg Rectal Daily Donielle Margaretann Loveless, PA-C      . docusate sodium (COLACE) capsule 200 mg  200 mg  Oral Daily Donielle M Zimmerman, PA-C      . lactated ringers infusion   Intravenous Continuous Ardelle Balls, PA-C 20 mL/hr at 01/02/13 0900    . metoprolol (LOPRESSOR) injection 2.5-5 mg  2.5-5 mg Intravenous Q2H PRN Ardelle Balls, PA-C      . metoprolol tartrate (LOPRESSOR) tablet 12.5 mg  12.5 mg Oral BID Ardelle Balls, PA-C   12.5 mg at 01/02/13 2325   Or  . metoprolol tartrate (LOPRESSOR) 25 mg/10 mL oral suspension 12.5 mg  12.5 mg Per Tube BID Ardelle Balls, PA-C      . morphine 2 MG/ML injection 2-5 mg  2-5 mg Intravenous Q1H PRN Ardelle Balls, PA-C   4 mg at 01/02/13 0956  . moving right along book   Does not apply Once Erin Barrett, PA-C      . ondansetron (ZOFRAN) injection 4 mg  4 mg Intravenous Q6H PRN Ardelle Balls, PA-C      . oxyCODONE (Oxy IR/ROXICODONE) immediate release tablet 5-10 mg  5-10 mg Oral Q3H PRN Ardelle Balls, PA-C   5 mg at 01/03/13 0859  . pantoprazole (PROTONIX) EC tablet 40 mg  40 mg Oral Daily Donielle Margaretann Loveless, PA-C      . sodium chloride 0.9 %  injection 3 mL  3 mL Intravenous Q12H Ardelle Balls, PA-C   3 mL at 01/02/13 2327  . sodium chloride 0.9 % injection 3 mL  3 mL Intravenous PRN Donielle M Zimmerman, PA-C      . sodium chloride 0.9 % injection 3 mL  3 mL Intravenous Q12H Erin Barrett, PA-C      . sodium chloride 0.9 % injection 3 mL  3 mL Intravenous PRN Erin Barrett, PA-C        PE: General appearance: alert, cooperative and no distress Lungs: clear to auscultation bilaterally Heart: regular rate and rhythm Extremities: no LEE Pulses: 2+ and symmetric Skin: warm and dry Neurologic: Grossly normal  Lab Results:   Recent Labs  01/02/13 0341 01/02/13 1600 01/02/13 1608 01/03/13 0420  WBC 11.2* 14.2*  --  11.8*  HGB 10.8* 11.4* 10.9* 10.2*  HCT 32.2* 34.0* 32.0* 30.7*  PLT 120* 129*  --  125*   BMET  Recent Labs  01/01/13 0601  01/02/13 0341 01/02/13 1600 01/02/13 1608  01/03/13 0420  NA 143  < > 141  --  140 135  K 3.9  < > 4.2  --  4.4 4.2  CL 107  < > 109  --  108 102  CO2 25  --  24  --   --  25  GLUCOSE 117*  < > 102*  --  142* 134*  BUN 11  < > 11  --  13 13  CREATININE 1.12  < > 1.20 1.20 1.30 1.10  CALCIUM 8.8  --  8.1*  --   --  8.1*  < > = values in this interval not displayed. PT/INR  Recent Labs  01/01/13 1537  LABPROT 15.6*  INR 1.27    Assessment/Plan  Principal Problem:   Unstable angina Active Problems:   Essential hypertension   Abnormal EKG   Coronary artery disease- noted on CT   Mitral valve regurgitation- moderate  Plan:  Day 2 s/p CABG x 4 (LIMA-LAD, SVG-first diag, SVG-Circ, SVG-PDA). Normal progression. Hemodynamically stable. NSR on telemetry. No post-operative arrhthymias noted. Plan, per TCTS is for transfer off surgical unit today to telemetry floor. Will continue with current plan of care. Will work on titrating heart meds as patient progresses.    LOS: 7 days    Leanard Dimaio M. Sharol Harness, PA-C 01/03/2013 9:22 AM

## 2013-01-03 NOTE — Progress Notes (Signed)
I have seen and evaluated the patient this PM along with Boyce Medici, PA. I agree with her findings, examination as well as impression recommendations.  Stable POD#2 s/p CABG x 4. Steady progression. Is net up I/O, but no sign of volume overload.   Titrate up BB dose as BP will tolerate.  Will follow along & be available for assistance.   MD Time with pt: 10 min  Aspynn Clover W, M.D., M.S. [facility] [address] [place], [place]  [address]  [phone] Pager # [phone] 01/03/2013 8:48 PM

## 2013-01-04 LAB — BASIC METABOLIC PANEL
BUN: 14 mg/dL (ref 6–23)
CO2: 27 mEq/L (ref 19–32)
Calcium: 8.4 mg/dL (ref 8.4–10.5)
Chloride: 105 mEq/L (ref 96–112)
Creatinine, Ser: 1.07 mg/dL (ref 0.50–1.35)
GFR calc Af Amer: 90 mL/min — ABNORMAL LOW (ref >90)
GFR calc non Af Amer: 77 mL/min — ABNORMAL LOW (ref >90)
Glucose, Bld: 139 mg/dL — ABNORMAL HIGH (ref 70–99)
Potassium: 3.9 mEq/L (ref 3.5–5.1)
Sodium: 139 mEq/L (ref 135–145)

## 2013-01-04 LAB — CBC
HCT: 30 % — ABNORMAL LOW (ref 39.0–52.0)
Hemoglobin: 10 g/dL — ABNORMAL LOW (ref 13.0–17.0)
MCH: 24.5 pg — ABNORMAL LOW (ref 26.0–34.0)
MCHC: 33.3 g/dL (ref 30.0–36.0)
MCV: 73.5 fL — ABNORMAL LOW (ref 78.0–100.0)
Platelets: 153 10*3/uL (ref 150–400)
RBC: 4.08 MIL/uL — ABNORMAL LOW (ref 4.22–5.81)
RDW: 14.3 % (ref 11.5–15.5)
WBC: 9.7 10*3/uL (ref 4.0–10.5)

## 2013-01-04 MED ORDER — POTASSIUM CHLORIDE CRYS ER 20 MEQ PO TBCR
20.0000 meq | EXTENDED_RELEASE_TABLET | Freq: Once | ORAL | Status: AC
Start: 2013-01-04 — End: 2013-01-04
  Administered 2013-01-04: 20 meq via ORAL
  Filled 2013-01-04: qty 1

## 2013-01-04 MED ORDER — POTASSIUM CHLORIDE CRYS ER 20 MEQ PO TBCR
20.0000 meq | EXTENDED_RELEASE_TABLET | Freq: Every day | ORAL | Status: DC
  Administered 2013-01-04 – 2013-01-05 (×2): 20 meq via ORAL
  Filled 2013-01-04: qty 1

## 2013-01-04 MED ORDER — FUROSEMIDE 40 MG PO TABS
40.0000 mg | ORAL_TABLET | Freq: Every day | ORAL | Status: DC
  Administered 2013-01-04 – 2013-01-05 (×2): 40 mg via ORAL
  Filled 2013-01-04 (×2): qty 1

## 2013-01-04 NOTE — Progress Notes (Signed)
[facility]  Subjective: No complaints. Out of bed sitting in a chair. States that is ha been ambulating w/ little difficulty.   Objective: Vital signs in last 24 hours: Temp:  [98.6 F (37 C)-100 F (37.8 C)] 98.6 F (37 C) (08/23 0427) Pulse Rate:  [76-92] 76 (08/23 0427) Resp:  [20-21] 20 (08/23 0427) BP: (102-139)/(61-72) 102/65 mmHg (08/23 0427) SpO2:  [92 %-98 %] 98 % (08/23 0427) Weight:  [191 lb 2.2 oz (86.7 kg)] 191 lb 2.2 oz (86.7 kg) (08/23 0428) Last BM Date: 01/03/13  Intake/Output from previous day: 08/22 0701 - 08/23 0700 In: 3 [I.V.:3] Out: 270 [Urine:270] Intake/Output this shift:    Medications Current Facility-Administered Medications  Medication Dose Route Frequency Provider Last Rate Last Dose  . 0.45 % sodium chloride infusion   Intravenous Continuous Ardelle Balls, PA-C 20 mL/hr at 01/02/13 0700    . 0.9 %  sodium chloride infusion  250 mL Intravenous Continuous Ardelle Balls, PA-C 1 mL/hr at 01/02/13 0640 250 mL at 01/02/13 0640  . 0.9 %  sodium chloride infusion  250 mL Intravenous PRN Erin Barrett, PA-C      . acetaminophen (TYLENOL) tablet 1,000 mg  1,000 mg Oral Q6H Donielle Margaretann Loveless, PA-C   1,000 mg at 01/04/13 0540   Or  . acetaminophen (TYLENOL) solution 1,000 mg  1,000 mg Per Tube Q6H Ardelle Balls, PA-C   1,000 mg at 01/01/13 2310  . aspirin EC tablet 325 mg  325 mg Oral Daily Donielle Margaretann Loveless, PA-C   325 mg at 01/03/13 1102   Or  . aspirin chewable tablet 324 mg  324 mg Per Tube Daily Donielle Margaretann Loveless, PA-C      . atorvastatin (LIPITOR) tablet 10 mg  10 mg Oral q1800 Wilburt Finlay, PA-C   10 mg at 01/03/13 1648  . bisacodyl (DULCOLAX) EC tablet 10 mg  10 mg Oral Daily Donielle Margaretann Loveless, PA-C   10 mg at 01/03/13 1103   Or  . bisacodyl (DULCOLAX) suppository 10 mg  10 mg Rectal Daily Donielle Margaretann Loveless, PA-C      . docusate sodium (COLACE) capsule 200 mg  200 mg Oral Daily  Ardelle Balls, PA-C   200 mg at 01/03/13 1103  . enoxaparin (LOVENOX) injection 30 mg  30 mg Subcutaneous Q24H Delight Ovens, MD   30 mg at 01/03/13 1102  . furosemide (LASIX) tablet 40 mg  40 mg Oral Daily Donielle M Zimmerman, PA-C      . metoprolol tartrate (LOPRESSOR) tablet 12.5 mg  12.5 mg Oral BID Ardelle Balls, PA-C   12.5 mg at 01/03/13 2103   Or  . metoprolol tartrate (LOPRESSOR) 25 mg/10 mL oral suspension 12.5 mg  12.5 mg Per Tube BID Ardelle Balls, PA-C      . ondansetron [facility]) injection 4 mg  4 mg Intravenous Q6H PRN Ardelle Balls, PA-C      . oxyCODONE (Oxy IR/ROXICODONE) immediate release tablet 5-10 mg  5-10 mg Oral Q3H PRN Ardelle Balls, PA-C   5 mg at 01/04/13 0738  . pantoprazole (PROTONIX) EC tablet 40 mg  40 mg Oral Daily Donielle Margaretann Loveless, PA-C   40 mg at 01/03/13 1103  . potassium chloride SA (K-DUR,KLOR-CON) CR tablet 20 mEq  20 mEq Oral Once Donielle M Zimmerman, PA-C      . potassium chloride SA (K-DUR,KLOR-CON) CR tablet 20 mEq  20 mEq Oral Daily  Donielle Margaretann Loveless, PA-C      . sodium chloride 0.9 % injection 3 mL  3 mL Intravenous Q12H Ardelle Balls, PA-C   3 mL at 01/02/13 2327  . sodium chloride 0.9 % injection 3 mL  3 mL Intravenous PRN Donielle Margaretann Loveless, PA-C      . sodium chloride 0.9 % injection 3 mL  3 mL Intravenous Q12H Erin Barrett, PA-C   3 mL at 01/03/13 2103  . sodium chloride 0.9 % injection 3 mL  3 mL Intravenous PRN Erin Barrett, PA-C        PE: General appearance: alert, cooperative and no distress Lungs: decreased breath sounds bilaterally Heart: regular rate and rhythm, S1, S2 normal, no murmur, click, rub or gallop Extremities: trace LEE Pulses: 2+ and symmetric Skin: warm and dry Neurologic: Grossly normal  Lab Results:   Recent Labs  01/02/13 1600 01/02/13 1608 01/03/13 0420 01/04/13 0433  WBC 14.2*  --  11.8* 9.7  HGB 11.4* 10.9* 10.2* 10.0*  HCT 34.0* 32.0* 30.7* 30.0*   PLT 129*  --  125* 153   BMET  Recent Labs  01/02/13 0341  01/02/13 1608 01/03/13 0420 01/04/13 0433  NA 141  --  140 135 139  K 4.2  --  4.4 4.2 3.9  CL 109  --  108 102 105  CO2 24  --   --  25 27  GLUCOSE 102*  --  142* 134* 139*  BUN 11  --  13 13 14   CREATININE 1.20  < > 1.30 1.10 1.07  CALCIUM 8.1*  --   --  8.1* 8.4  < > = values in this interval not displayed. PT/INR  Recent Labs  01/01/13 1537  LABPROT 15.6*  INR 1.27    Assessment/Plan  Principal Problem:   Unstable angina Active Problems:   Essential hypertension   Abnormal EKG   Coronary artery disease- noted on CT   Mitral valve regurgitation- moderate   S/P CABG x 4: LIMA-LAD, SVG-D1, SVG-Cx, SVG-RCA  Plan: Day 3 s/p CABG. Progressing well. NSR on telemetry. No arrhthymias. HR in the 80s. Will continue on low dose lopressor as BP is a bit soft, with SBP in the low 100s. Continue with ASA, BB, statin, as well as Lasix for diuresis. Fluid balance is + 5L. Renal function is WNL. Elelctrolytes are WNL. Continue with maintenance potassium to prevent hypokalemia. Will continue to follow.     LOS: 8 days    Nathan Buckley 01/04/2013 9:33 AM

## 2013-01-04 NOTE — Progress Notes (Addendum)
      [address]       [name] [address]             [phone]        3 Days Post-Op Procedure(s) (LRB): CORONARY ARTERY BYPASS GRAFTING times four using Right Greater Saphenous Vein Graft harvested endoscopically and Left Internal Mammary Artery (N/A) INTRAOPERATIVE TRANSESOPHAGEAL ECHOCARDIOGRAM (N/A)  Subjective: Patient sitting at side of the bed. He has no complaints.  Objective: Vital signs in last 24 hours: Temp:  [98.6 F (37 C)-100 F (37.8 C)] 98.6 F (37 C) (08/23 0427) Pulse Rate:  [76-92] 76 (08/23 0427) Cardiac Rhythm:  [-] Normal sinus rhythm (08/23 0740) Resp:  [20-21] 20 (08/23 0427) BP: (102-139)/(61-72) 102/65 mmHg (08/23 0427) SpO2:  [92 %-98 %] 98 % (08/23 0427) Weight:  [86.7 kg (191 lb 2.2 oz)] 86.7 kg (191 lb 2.2 oz) (08/23 0428)  Pre op weight  87 kg Current Weight  01/04/13 86.7 kg (191 lb 2.2 oz)      Intake/Output from previous day: 08/22 0701 - 08/23 0700 In: 3 [I.V.:3] Out: 270 [Urine:270]   Physical Exam:  Cardiovascular: RRR, no murmurs, gallops, or rubs. Pulmonary: Diminished at bases; no rales, wheezes, or rhonchi. Abdomen: Soft, non tender, bowel sounds present. Extremities: Mild bilateral lower extremity edema. Wounds: Clean and dry.  No erythema or signs of infection.  Lab Results: CBC: Recent Labs  01/03/13 0420 01/04/13 0433  WBC 11.8* 9.7  HGB 10.2* 10.0*  HCT 30.7* 30.0*  PLT 125* 153   BMET:  Recent Labs  01/03/13 0420 01/04/13 0433  NA 135 139  K 4.2 3.9  CL 102 105  CO2 25 27  GLUCOSE 134* 139*  BUN 13 14  CREATININE 1.10 1.07  CALCIUM 8.1* 8.4    PT/INR:  Lab Results  Component Value Date   INR 1.27 01/01/2013   INR 0.98 12/29/2012   INR 0.97 12/27/2012   ABG:  INR: Will add last result for INR, ABG once components are confirmed Will add last 4 CBG results once components are confirmed  Assessment/Plan:  1. CV - SR. On Lopressor 12.5 bid. 2.  Pulmonary - Encourage  incentive spirometer. 3. Volume Overload -  Lasix 40 daily. 4.  Acute blood loss anemia - H and H stable at 10 and 30. 5.Supplement potassium 6.CBGs 109/116/120. Pre op HGA1C 6.3. Likely pre diabetic and will need further surveillance as outpatient. 7.Remove EPW in am 8.Likely discharge on Monday  ZIMMERMAN,DONIELLE MPA-C 01/04/2013,9:04 AM  patient examined and medical record reviewed,agree with above note. VAN TRIGT III,Dalyla Chui 01/04/2013

## 2013-01-04 NOTE — Progress Notes (Signed)
Pt. Seen and examined. Agree with the NP/PA-C note as written.  He is doing extremely well - he does appear volume overloaded. I would recommend starting diuresis today. Still with chest soreness, but overall doing very well. He is very thankful to the heart team for saving his life.  Chrystie Nose, MD, [facility] Attending Cardiologist [facility]

## 2013-01-04 NOTE — Progress Notes (Signed)
CARDIAC REHAB PHASE I   PRE:  Rate/Rhythm: 90 SR    BP: sitting 116/70    SaO2: 95 2L  MODE:  Ambulation: 550 ft   POST:  Rate/Rhythm: 107 ST    BP: sitting 120/76     SaO2: 93 RA  Tolerated well. Moving better today with RW. SaO2 good on RA. Encouraged more walking and IS. ? Will need RW for home.  [phone]   Elissa Lovett [place] CES, ACSM 01/04/2013 12:00 PM

## 2013-01-04 NOTE — Progress Notes (Signed)
Pt ambulated from 2w25 to 2w07 at this time; pt denies pain; family in room; pt assisted to chair; pt ambulated with rolling walker; will cont. To monitor.

## 2013-01-04 NOTE — Progress Notes (Signed)
Pt up ambulating with NT and rolling walker; steady gait noted; will cont. To monitor.

## 2013-01-05 DIAGNOSIS — I10 Essential (primary) hypertension: Secondary | ICD-10-CM

## 2013-01-05 MED ORDER — ASPIRIN 325 MG PO TBEC: 325.0000 mg | DELAYED_RELEASE_TABLET | Freq: Every day | ORAL | Status: DC

## 2013-01-05 MED ORDER — POTASSIUM CHLORIDE CRYS ER 20 MEQ PO TBCR: 20.0000 meq | EXTENDED_RELEASE_TABLET | Freq: Every day | ORAL | Status: DC

## 2013-01-05 MED ORDER — FUROSEMIDE 40 MG PO TABS: 40.0000 mg | ORAL_TABLET | Freq: Every day | ORAL | Status: DC

## 2013-01-05 MED ORDER — METOPROLOL TARTRATE 25 MG PO TABS: 25.0000 mg | ORAL_TABLET | Freq: Two times a day (BID) | ORAL | Status: DC

## 2013-01-05 MED ORDER — ATORVASTATIN CALCIUM 10 MG PO TABS: 10.0000 mg | ORAL_TABLET | Freq: Every day | ORAL | Status: DC

## 2013-01-05 MED ORDER — LISINOPRIL 2.5 MG PO TABS: 2.5000 mg | ORAL_TABLET | Freq: Every day | ORAL | Status: DC

## 2013-01-05 MED ORDER — LISINOPRIL 2.5 MG PO TABS
2.5000 mg | ORAL_TABLET | Freq: Every day | ORAL | Status: DC
  Administered 2013-01-05: 2.5 mg via ORAL
  Filled 2013-01-05: qty 1

## 2013-01-05 MED ORDER — METOPROLOL TARTRATE 25 MG PO TABS
25.0000 mg | ORAL_TABLET | Freq: Two times a day (BID) | ORAL | Status: DC
  Administered 2013-01-05: 25 mg via ORAL
  Filled 2013-01-05 (×2): qty 1

## 2013-01-05 MED ORDER — OXYCODONE HCL 5 MG PO TABS: 5.0000 mg | ORAL_TABLET | ORAL | Status: DC | PRN

## 2013-01-05 NOTE — Progress Notes (Signed)
Chest tube sutures removed at this time; steri strips applied to site; will cont. To monitor. 

## 2013-01-05 NOTE — Progress Notes (Signed)
Pt up ambulating in hallway with family and rolling walker; no needs voided; will cont. To monitor.

## 2013-01-05 NOTE — Progress Notes (Signed)
Assessed patient's ambulation status.  Patient stated that he had walked three times today. Will continue to monitor.

## 2013-01-05 NOTE — Progress Notes (Addendum)
      [address]       [place] [address]             [phone]        4 Days Post-Op Procedure(s) (LRB): CORONARY ARTERY BYPASS GRAFTING times four using Right Greater Saphenous Vein Graft harvested endoscopically and Left Internal Mammary Artery (N/A) INTRAOPERATIVE TRANSESOPHAGEAL ECHOCARDIOGRAM (N/A)  Subjective: Patient sitting in chair and without complaints. He wants to go home.   Objective: Vital signs in last 24 hours: Temp:  [98.9 F (37.2 C)-99.4 F (37.4 C)] 98.9 F (37.2 C) (08/24 0412) Pulse Rate:  [80-87] 87 (08/24 0412) Cardiac Rhythm:  [-] Normal sinus rhythm (08/23 2043) Resp:  [16-18] 18 (08/24 0412) BP: (107-130)/(64-74) 130/74 mmHg (08/24 0412) SpO2:  [94 %-98 %] 94 % (08/24 0412) Weight:  [86.41 kg (190 lb 8 oz)] 86.41 kg (190 lb 8 oz) (08/24 0549)  Pre op weight  87 kg Current Weight  01/05/13 86.41 kg (190 lb 8 oz)      Intake/Output from previous day: 08/23 0701 - 08/24 0700 In: 360 [P.O.:360] Out: -    Physical Exam:  Cardiovascular: RRR, no murmurs, gallops, or rubs. Pulmonary:Clear to auscultation bilaterally; no rales, wheezes, or rhonchi. Abdomen: Soft, non tender, bowel sounds present. Extremities: Mild bilateral lower extremity edema. Wounds: Clean and dry.  No erythema or signs of infection.  Lab Results: CBC:  Recent Labs  01/03/13 0420 01/04/13 0433  WBC 11.8* 9.7  HGB 10.2* 10.0*  HCT 30.7* 30.0*  PLT 125* 153   BMET:   Recent Labs  01/03/13 0420 01/04/13 0433  NA 135 139  K 4.2 3.9  CL 102 105  CO2 25 27  GLUCOSE 134* 139*  BUN 13 14  CREATININE 1.10 1.07  CALCIUM 8.1* 8.4    PT/INR:  Lab Results  Component Value Date   INR 1.27 01/01/2013   INR 0.98 12/29/2012   INR 0.97 12/27/2012   ABG:  INR: Will add last result for INR, ABG once components are confirmed Will add last 4 CBG results once components are confirmed  Assessment/Plan:  1. CV - SR. On Lopressor 12.5 bid. Will  increase to 25 bid. As discussed with Dr. Herbie Baltimore, will start low dose ACE. 2.  Pulmonary - Encourage incentive spirometer. 3. Volume Overload -  Continue Lasix 40 daily. 4.  Acute blood loss anemia - H and H stable at 10 and 30. 5.Remove EPW  6.Remove sutures  7.Per Dr. Donata Clay, ok for discharge later today.  Nathan Buckley MPA-C 01/05/2013,7:39 AM

## 2013-01-05 NOTE — Progress Notes (Signed)
Pt and family watching d/c video at this time; will cont. To monitor.

## 2013-01-05 NOTE — Progress Notes (Signed)
EPW d/c at this time per MD order; dressing to MSI removed; MSI OTA, no drainage noted; pt tolerated EPW removal well; VSS; wife at bedside; bedrest until 1110; pt encouraged to use urinal so RN can measure urine output; pt stated he has voided multiple times in toilet this AM; will cont. To monitor.

## 2013-01-05 NOTE — Progress Notes (Addendum)
Pt and family given d/c instructions; IV and tele monitor removed at this time; pt and wife verbalized d/c instructions via teach back method; will cont. To monitor.

## 2013-01-05 NOTE — Progress Notes (Signed)
Subjective:  Says he feels good.  Had urinated in the toilet several times (not counted). Has ambulated in the hall without difficulty. Is using the IS frequently & effectively.  Objective:  Vital Signs in the last 24 hours: Temp:  [98.9 F (37.2 C)-99.4 F (37.4 C)] 98.9 F (37.2 C) (08/24 0412) Pulse Rate:  [80-87] 87 (08/24 0412) Resp:  [16-18] 18 (08/24 0412) BP: (107-130)/(64-74) 130/74 mmHg (08/24 0412) SpO2:  [94 %-98 %] 94 % (08/24 0412) Weight:  [190 lb 8 oz (86.41 kg)] 190 lb 8 oz (86.41 kg) (08/24 0549)  Intake/Output from previous day: 08/23 0701 - 08/24 0700 In: 360 [P.O.:360] Out: -  Intake/Output from this shift:   Physical Exam: General appearance: alert, cooperative, appears stated age, no distress and very pleasant mood & affect.  Very thankful. Neck: no adenopathy, no carotid bruit, no JVD and supple, symmetrical, trachea midline Lungs: clear to auscultation bilaterally, normal percussion bilaterally and non-labored Heart: regular rate and rhythm, S1, S2 normal, no murmur, click, rub or gallop and normal apical impulse Abdomen: soft, non-tender; bowel sounds normal; no masses,  no organomegaly Extremities: edema trace Pulses: 2+ and symmetric Neurologic: Grossly normal  Lab Results:  Recent Labs  01/03/13 0420 01/04/13 0433  WBC 11.8* 9.7  HGB 10.2* 10.0*  PLT 125* 153    Recent Labs  01/03/13 0420 01/04/13 0433  NA 135 139  K 4.2 3.9  CL 102 105  CO2 25 27  GLUCOSE 134* 139*  BUN 13 14  CREATININE 1.10 1.07   Imaging: none in 2 days  Cardiac Studies: tele stable NSR  Assessment/Plan:  Principal Problem:   Unstable angina Active Problems:   Coronary artery disease- noted on CT   S/P CABG x 4: LIMA-LAD, SVG-D1, SVG-Cx, SVG-RCA   Essential hypertension   Abnormal EKG   Mitral valve regurgitation- moderate POD # 4 - CABG x4  Stable  Recovery with BP & HR well controlled on BB dose. His I/O do not accurately reflect true UOP --  has urinated several times in toilet & does not look 5 L up.  Maybe 1-2.  Agree with gentle diuresis. H/H relatively stable. - once stable, can consider adding second antiplatelet agent (Plavix or Brilinta based upon CURE trial).  Anticipate d/c home soon -- f/u with either Dr. Royann Shivers or [facility]    LOS: 9 days    Kimiyo Carmicheal W 01/05/2013, 9:23 AM

## 2013-01-05 NOTE — Progress Notes (Signed)
Pt ambulated 550 feet with rolling walker; steady gait; pt back to room to chair at this time; wife in room; will cont. To monitor.

## 2013-01-07 MED FILL — Magnesium Sulfate Inj 50%: INTRAMUSCULAR | Qty: 10 | Status: AC

## 2013-01-07 MED FILL — Heparin Sodium (Porcine) Inj 1000 Unit/ML: INTRAMUSCULAR | Qty: 30 | Status: AC

## 2013-01-07 MED FILL — Cefuroxime Sodium For Inj 750 MG: INTRAMUSCULAR | Qty: 750 | Status: AC

## 2013-01-07 MED FILL — Sodium Chloride IV Soln 0.9%: INTRAVENOUS | Qty: 1000 | Status: AC

## 2013-01-07 MED FILL — Potassium Chloride Inj 2 mEq/ML: INTRAVENOUS | Qty: 40 | Status: AC

## 2013-01-08 ENCOUNTER — Other Ambulatory Visit: Payer: Self-pay | Admitting: Family Medicine

## 2013-01-08 ENCOUNTER — Other Ambulatory Visit: Payer: Self-pay | Admitting: *Deleted

## 2013-01-08 DIAGNOSIS — G8918 Other acute postprocedural pain: Secondary | ICD-10-CM

## 2013-01-08 MED ORDER — OXYCODONE HCL 5 MG PO TABS: 5.0000 mg | ORAL_TABLET | ORAL | Status: DC | PRN

## 2013-01-14 ENCOUNTER — Other Ambulatory Visit: Payer: Self-pay | Admitting: *Deleted

## 2013-01-14 DIAGNOSIS — G8918 Other acute postprocedural pain: Secondary | ICD-10-CM

## 2013-01-14 MED ORDER — HYDROCODONE-ACETAMINOPHEN 7.5-325 MG PO TABS: 1.0000 | ORAL_TABLET | ORAL | Status: DC | PRN

## 2013-01-16 ENCOUNTER — Telehealth: Payer: Self-pay | Admitting: *Deleted

## 2013-01-16 NOTE — Telephone Encounter (Signed)
Faxed completed Cardiac Rehabilitation Program Treatment Plan on 01/16/13

## 2013-01-22 ENCOUNTER — Ambulatory Visit: Payer: PRIVATE HEALTH INSURANCE | Admitting: Internal Medicine

## 2013-01-22 ENCOUNTER — Other Ambulatory Visit: Payer: Self-pay | Admitting: *Deleted

## 2013-01-22 DIAGNOSIS — G8918 Other acute postprocedural pain: Secondary | ICD-10-CM

## 2013-01-22 MED ORDER — HYDROCODONE-ACETAMINOPHEN 7.5-325 MG PO TABS: 1.0000 | ORAL_TABLET | ORAL | Status: DC | PRN

## 2013-01-24 ENCOUNTER — Encounter: Payer: Self-pay | Admitting: Internal Medicine

## 2013-01-29 ENCOUNTER — Telehealth: Payer: Self-pay | Admitting: *Deleted

## 2013-01-29 ENCOUNTER — Ambulatory Visit (INDEPENDENT_AMBULATORY_CARE_PROVIDER_SITE_OTHER): Payer: PRIVATE HEALTH INSURANCE | Admitting: Internal Medicine

## 2013-01-29 ENCOUNTER — Encounter: Payer: Self-pay | Admitting: Internal Medicine

## 2013-01-29 VITALS — BP 134/90 | HR 76 | Ht 63.0 in | Wt 187.1 lb

## 2013-01-29 DIAGNOSIS — G8918 Other acute postprocedural pain: Secondary | ICD-10-CM

## 2013-01-29 DIAGNOSIS — R933 Abnormal findings on diagnostic imaging of other parts of digestive tract: Secondary | ICD-10-CM

## 2013-01-29 DIAGNOSIS — K228 Other specified diseases of esophagus: Secondary | ICD-10-CM

## 2013-01-29 DIAGNOSIS — Z1211 Encounter for screening for malignant neoplasm of colon: Secondary | ICD-10-CM

## 2013-01-29 MED ORDER — MOVIPREP 100 G PO SOLR: ORAL | Status: DC

## 2013-01-29 MED ORDER — HYDROCODONE-ACETAMINOPHEN 7.5-325 MG PO TABS: 1.0000 | ORAL_TABLET | ORAL | Status: DC | PRN

## 2013-01-29 MED ORDER — OMEPRAZOLE 40 MG PO CPDR: 40.0000 mg | DELAYED_RELEASE_CAPSULE | Freq: Every day | ORAL | Status: DC

## 2013-01-29 NOTE — Telephone Encounter (Signed)
REQUESTING PAIN MED REFILL

## 2013-01-29 NOTE — Progress Notes (Signed)
Patient ID: Nathan Buckley, male   DOB: Jun 02, 1960, 52 y.o.   MRN: [id number] HPI: Nathan Buckley is a 52 yo male with PMH of CAD s/p CABG in Aug 2014, hypertension, hyperlipidemia who is seen in consultation at the request of Dr. Katrinka Blazing for evaluation of abdominal pain. Patient reports he was having significant abdominal pain worse after eating. He was also having chest pain and dyspnea, and was admitted in mid August 2014 with unstable angina. He subsequently went for four-vessel coronary artery bypass grafting. He is recovering well and since has had complete resolution of his abdominal pain. He reports he is now eating well without nausea or vomiting. He denies current dysphagia or odynophagia. He was started on omeprazole 40 mg last month and he has tolerated this well. He currently denies heartburn. He does report some mild constipation. No diarrhea, rectal bleeding or melena. He has never had an upper endoscopy or colonoscopy  Of note he did have a CT scan of his chest abdomen and pelvis performed last month which showed distal esophageal thickening  Past Medical History  Diagnosis Date  . Allergy   . Hypertension   . HLD (hyperlipidemia)   . Hypokalemia   . Cardiomyopathy     Past Surgical History  Procedure Laterality Date  . Coronary artery bypass graft N/A 01/01/2013    Procedure: CORONARY ARTERY BYPASS GRAFTING times four using Right Greater Saphenous Vein Graft harvested endoscopically and Left Internal Mammary Artery;  Surgeon: Delight Ovens, MD;  Location: [facility] OR;  Service: Open Heart Surgery;  Laterality: N/A;  . Intraoperative transesophageal echocardiogram N/A 01/01/2013    Procedure: INTRAOPERATIVE TRANSESOPHAGEAL ECHOCARDIOGRAM;  Surgeon: Delight Ovens, MD;  Location: [facility];  Service: Open Heart Surgery;  Laterality: N/A;    Current Outpatient Prescriptions  Medication Sig Dispense Refill  . aspirin EC 325 MG EC tablet Take 1 tablet (325 mg total) by  mouth daily.  30 tablet  0  . atorvastatin (LIPITOR) 10 MG tablet Take 1 tablet (10 mg total) by mouth daily at 6 PM.  30 tablet  1  . furosemide (LASIX) 40 MG tablet Take 1 tablet (40 mg total) by mouth daily. For 4 days then stop.  4 tablet  0  . lisinopril (PRINIVIL,ZESTRIL) 2.5 MG tablet Take 1 tablet (2.5 mg total) by mouth daily.  30 tablet  1  . metoprolol tartrate (LOPRESSOR) 25 MG tablet Take 1 tablet (25 mg total) by mouth 2 (two) times daily.  60 tablet  1  . omeprazole (PRILOSEC) 40 MG capsule Take 1 capsule (40 mg total) by mouth daily.  30 capsule  3  . potassium chloride SA (K-DUR,KLOR-CON) 20 MEQ tablet Take 1 tablet (20 mEq total) by mouth daily. For 4 days then stop.  4 tablet  0  . HYDROcodone-acetaminophen (NORCO) 7.5-325 MG per tablet Take 1 tablet by mouth every 4 (four) hours as needed for pain.  40 tablet  0  . MOVIPREP 100 G SOLR Use per prep instruction  1 kit  0   No current facility-administered medications for this visit.    No Known Allergies  History reviewed. No pertinent family history.  History  Substance Use Topics  . Smoking status: Former Smoker    Quit date: 05/15/1998  . Smokeless tobacco: Never Used  . Alcohol Use: Yes     Comment: rarely- beer once a month    ROS: As per history of present illness, otherwise negative  BP 134/90  Pulse 76  Ht 5\' 3"  (1.6 m)  Wt 187 lb 2 oz (84.879 kg)  BMI 33.16 kg/m2 Constitutional: Well-developed and well-nourished. No distress. HEENT: Normocephalic and atraumatic. Oropharynx is clear and moist. No oropharyngeal exudate. Conjunctivae are normal.  No scleral icterus. Neck: Neck supple. Trachea midline. Cardiovascular: Normal rate, regular rhythm and intact distal pulses. Well healing sternal scar Pulmonary/chest: Effort normal and breath sounds normal. No wheezing, rales or rhonchi. Abdominal: Soft, nontender, nondistended. Bowel sounds active throughout. There are no masses palpable. No  hepatosplenomegaly. Extremities: no clubbing, cyanosis, or edema Lymphadenopathy: No cervical adenopathy noted. Neurological: Alert and oriented to person place and time. Skin: Skin is warm and dry. No rashes noted. Psychiatric: Normal mood and affect. Behavior is normal.  RELEVANT LABS AND IMAGING: CBC    Component Value Date/Time   WBC 9.7 01/04/2013 0433   WBC 6.1 12/26/2012 1358   RBC 4.08* 01/04/2013 0433   RBC 5.88 12/26/2012 1358   HGB 10.0* 01/04/2013 0433   HGB 14.5 12/26/2012 1358   HCT 30.0* 01/04/2013 0433   HCT 46.1 12/26/2012 1358   PLT 153 01/04/2013 0433   MCV 73.5* 01/04/2013 0433   MCV 78.4* 12/26/2012 1358   MCH 24.5* 01/04/2013 0433   MCH 24.7* 12/26/2012 1358   MCHC 33.3 01/04/2013 0433   MCHC 31.5* 12/26/2012 1358   RDW 14.3 01/04/2013 0433   LYMPHSABS 0.9 04/23/2008 2146   MONOABS 0.7 04/23/2008 2146   EOSABS 0.0 04/23/2008 2146   BASOSABS 0.0 04/23/2008 2146    CMP     Component Value Date/Time   NA 139 01/04/2013 0433   K 3.9 01/04/2013 0433   CL 105 01/04/2013 0433   CO2 27 01/04/2013 0433   GLUCOSE 139* 01/04/2013 0433   BUN 14 01/04/2013 0433   CREATININE 1.07 01/04/2013 0433   CREATININE 1.11 12/26/2012 1355   CALCIUM 8.4 01/04/2013 0433   PROT 6.6 12/27/2012 2230   ALBUMIN 3.2* 12/27/2012 2230   AST 19 12/27/2012 2230   ALT 23 12/27/2012 2230   ALKPHOS 59 12/27/2012 2230   BILITOT 0.3 12/27/2012 2230   GFRNONAA 77* 01/04/2013 0433   GFRAA 90* 01/04/2013 0433   Clinical Data:  Abdominal pain radiating into chest and back, evaluate for dissection   CT ANGIOGRAPHY CHEST, ABDOMEN AND PELVIS -- 12/27/2012   Technique:  Multidetector CT imaging through the chest, abdomen and pelvis was performed using the standard protocol during bolus administration of intravenous contrast.  Multiplanar reconstructed images including MIPs were obtained and reviewed to evaluate the vascular anatomy.   Contrast: OMNIPAQUE IOHEXOL 350 MG/ML SOLN   Comparison:   Chest x-ray  12/27/2012; prior CT abdomen/pelvis 04/22/2008   CTA CHEST   Findings:   Mediastinum: Nonspecific heterogeneous appearance of thyroid gland. Probable 12 mm nodule in the left thyroid gland is nonspecific by CT.  Mild circumferential thickening of the distal esophageal wall. No suspicious mediastinal or hilar adenopathy.  No mediastinal mass.   Heart/Vascular: Conventional three-vessel arch anatomy.  No acute intramural hematoma, dissection or evidence of aneurysmal dilatation.  Evaluation of the coronary arteries in cardiac structures slightly limited by noncardiac gated technique.  There is atherosclerotic calcification along the course of the left anterior descending coronary artery.  The heart is at the upper limits of normal for size. Focal hypoattenuation of the myocardium at the inferior ventricular apex with associated thinning suggest remodelling from prior myocardial infarction.  No pericardial effusion. Unremarkable pulmonary artery.  No central embolus.   Lungs/Pleura: Mild dependent  atelectasis in the lower lobes.  3 mm nonspecific nodule in the right middle lobe.  A 2.5 mm nodules identified in the central anterior right lower lobe.  The presence of small multi focal pulmonary nodules is highly likely the sequelae of prior infection/inflammation/granulomatous process in the absence of a known primary malignancy.  Mild bilateral lower lobe bronchial wall thickening.   Bones: No acute fracture or aggressive appearing lytic or blastic osseous lesion.    Review of the MIP images confirms the above findings.   IMPRESSION:   1.  Negative for acute aortic pathology or aneurysmal dilatation.   2.  Atherosclerosis including coronary artery disease.   3.  Borderline cardiomegaly with sequelae suggesting prior remote myocardial infarction involving the apex and apical inferior wall.   4.  Mild circumferential thickening of the distal esophageal wall. Recommend  clinical correlation for signs and symptoms of gastroesophageal reflux disease.   5.  Probable 12 mm left thyroid nodule is nonspecific by CT. Consider non emergent evaluation with dedicated thyroid ultrasound.   6. Mild bilateral lower lobe bronchial wall thickening as can be seen in both acute and chronic bronchitis.   7.  Several small (2 - 3 mm) pulmonary nodules.  In the absence of a known primary malignancy, these are highly likely the sequela of a prior infectious/inflammatory or granulomatous process.   CTA ABDOMEN AND PELVIS   Findings:   VASCULAR   Aorta: No evidence of acute intramural hematoma, aneurysmal dilatation or dissection.  Minimal atherosclerotic vascular disease in the distal aorta and aortic bifurcation.   Celiac: Widely patent.  Conventional hepatic arterial anatomy.   SMA: Widely patent.   Renals: Two right and one left renal arteries are widely patent. No evidence of changes of fibromuscular dysplasia.   IMA: There may be mild narrowing at the origin.  The more distal vessel is widely patent.   Inflow: Trace atherosclerotic vascular disease without aneurysmal dilatation or stenosis in the bilateral common iliac arteries.   Veins: Given arterial phase timing evaluation is limited.  No focal abnormality identified.   NON-VASCULAR   Abdomen: Unremarkable CT appearance of the stomach, duodenum, spleen, adrenal glands and pancreas.  Normal hepatic morphology and contour.  No discrete hepatic lesion.  Gallbladder is unremarkable. No intra or extrahepatic biliary ductal dilatation.   Symmetric parenchymal enhancement of the kidneys.  No hydronephrosis or nephrolithiasis.  No enhancing renal mass.  Tiny sub centimeter bilateral hypoattenuating lesions are too small to characterize but statistically likely benign cysts.   Normal-caliber large and small bowel.  No evidence of bowel obstruction.  Normal appendix identified in the right  lower quadrant.  No free fluid or suspicious adenopathy.   Bones: No acute fracture or aggressive appearing lytic or blastic osseous lesion. .   Review of the MIP images confirms the above findings.   IMPRESSION:   1.  No acute aortic pathology or aneurysmal dilatation. 2.  No acute abnormality in the abdomen or pelvis to explain the patient's clinical symptoms. 3.  Mild atherosclerotic vascular disease without significant stenosis.    ASSESSMENT/PLAN: 52 yo male with PMH of CAD s/p CABG in Aug 2014, hypertension, hyperlipidemia who is seen in consultation at the request of Dr. Katrinka Blazing for evaluation of abdominal pain.  1.  Abd pain -- resolved after CABG.  Likely related to angina and coronary hypoperfusion.  2.  Distal esophageal thickening/abnormal GI imaging -- he is on omeprazole 40 mg daily. We will continue this for now. He  is without GERD symptoms on this medication. I have recommended upper endoscopy to further evaluate the lower esophagus and rule out significant pathology. The test today was discussed including the risks and benefits and he is agreeable to proceed  3.  CRC screening -- he has never had screening colonoscopy. I recommended this test for him, and after discussion of the risks and benefits he is agreeable to proceed. This can be performed on the same day as his upper endoscopy  I would like for him to see Dr. Tyrone Sage on 02/06/2013 for his CABG followup before proceeding with endoscopy.

## 2013-01-29 NOTE — Patient Instructions (Addendum)
You have been scheduled for a Endoscopy/colonoscopy with propofol. Please follow written instructions given to you at your visit today.  Please pick up your prep kit at the pharmacy within the next 1-3 days. If you use inhalers (even only as needed), please bring them with you on the day of your procedure. Your physician has requested that you go to www.startemmi.com and enter the access code given to you at your visit today. This web site gives a general overview about your procedure. However, you should still follow specific instructions given to you by our office regarding your preparation for the procedure.  We have sent the following medications to your pharmacy for you to pick up at your convenience: Moviprep  Continue taking Omeprazole   Stop taking carafate.                                               We are excited to introduce MyChart, a new best-in-class service that provides you online access to important information in your electronic medical record. We want to make it easier for you to view your health information - all in one secure location - when and where you need it. We expect MyChart will enhance the quality of care and service we provide.  When you register for MyChart, you can:    View your test results.    Request appointments and receive appointment reminders via email.    Request medication renewals.    View your medical history, allergies, medications and immunizations.    Communicate with your physician's office through a password-protected site.    Conveniently print information such as your medication lists.  To find out if MyChart is right for you, please talk to a member of our clinical staff today. We will gladly answer your questions about this free health and wellness tool.  If you are age 52 or older and want a member of your family to have access to your record, you must provide written consent by completing a proxy form available at our office.  Please speak to our clinical staff about guidelines regarding accounts for patients younger than age 60.  As you activate your MyChart account and need any technical assistance, please call the MyChart technical support line at [phone]) or email your question to [email or web address]. If you email your question(s), please include your name, a return phone number and the best time to reach you.  If you have non-urgent health-related questions, you can send a message to our office through MyChart at [email or web address]. If you have a medical emergency, call 911.  Thank you for using MyChart as your new health and wellness resource!   MyChart licensed from [employer],  4540-9811. Patents Pending.

## 2013-02-06 ENCOUNTER — Encounter: Payer: Self-pay | Admitting: Cardiothoracic Surgery

## 2013-02-06 ENCOUNTER — Encounter: Payer: Self-pay | Admitting: Internal Medicine

## 2013-02-06 ENCOUNTER — Ambulatory Visit (INDEPENDENT_AMBULATORY_CARE_PROVIDER_SITE_OTHER): Payer: Self-pay | Admitting: Cardiothoracic Surgery

## 2013-02-06 ENCOUNTER — Other Ambulatory Visit: Payer: Self-pay | Admitting: *Deleted

## 2013-02-06 ENCOUNTER — Ambulatory Visit
Admission: RE | Admit: 2013-02-06 | Discharge: 2013-02-06 | Disposition: A | Discharge status: Home / Self Care | Payer: BC Managed Care – PPO | Source: Ambulatory Visit | Attending: Cardiothoracic Surgery | Admitting: Cardiothoracic Surgery

## 2013-02-06 VITALS — BP 167/99 | HR 66 | Resp 20 | Ht 63.0 in | Wt 187.0 lb

## 2013-02-06 DIAGNOSIS — I2510 Atherosclerotic heart disease of native coronary artery without angina pectoris: Secondary | ICD-10-CM

## 2013-02-06 DIAGNOSIS — I340 Nonrheumatic mitral (valve) insufficiency: Secondary | ICD-10-CM

## 2013-02-06 DIAGNOSIS — I059 Rheumatic mitral valve disease, unspecified: Secondary | ICD-10-CM

## 2013-02-06 DIAGNOSIS — R918 Other nonspecific abnormal finding of lung field: Secondary | ICD-10-CM

## 2013-02-06 DIAGNOSIS — Z951 Presence of aortocoronary bypass graft: Secondary | ICD-10-CM

## 2013-02-06 NOTE — Progress Notes (Signed)
[address]       [place] [address]             [phone]                  Nathan Buckley [facility] Medical Record #[id number] Date of Birth: Oct 06, 1960  Marykay Lex, MD No PCP Per Patient  Chief Complaint:   PostOp Follow Up Visit 01/01/2013  PREOPERATIVE DIAGNOSIS: Coronary occlusive disease with unstable  angina.  POSTOPERATIVE DIAGNOSIS: Coronary occlusive disease with unstable  angina.  SURGICAL PROCEDURE: Coronary artery bypass grafting x4 with the left  internal mammary to the left anterior descending coronary artery,  reverse saphenous vein graft to the first diagonal coronary artery,  reverse saphenous vein graft to the distal circumflex coronary artery,  reverse saphenous vein graft to the posterior descending coronary artery  with right thigh and leg Endovein harvesting and TEE.  SURGEON: Sheliah Plane, MD   History of Present Illness:     Patient returns to the office today after recent coronary artery bypass grafting. He presented to the [facility] with no previous primary care doctor complaining of abdominal and chest pain. CT scan of the abdomen showed several small 3 mm pulmonary nodules, thyroid nodule, thickening of the distal esophagus. Ultimately he was found to have significant coronary artery disease by cardiac catheterization and underwent coronary artery bypass grafting on 8/ 20. He's done well postoperatively, incidentally has had no further abdominal pain.  He saw GI while hospitalized and has a followup appointment for upper and lower endoscopy next week.    History  Smoking status  . Former Smoker  . Quit date: 05/15/1998  Smokeless tobacco  . Never Used       No Known Allergies  Current Outpatient Prescriptions  Medication Sig Dispense Refill  . aspirin EC 325 MG EC tablet Take 1 tablet (325 mg total) by mouth daily.  30 tablet  0  . atorvastatin (LIPITOR) 10 MG tablet Take 1 tablet (10  mg total) by mouth daily at 6 PM.  30 tablet  1  . HYDROcodone-acetaminophen (NORCO) 7.5-325 MG per tablet Take 1 tablet by mouth every 4 (four) hours as needed for pain.  40 tablet  0  . lisinopril (PRINIVIL,ZESTRIL) 2.5 MG tablet Take 1 tablet (2.5 mg total) by mouth daily.  30 tablet  1  . metoprolol tartrate (LOPRESSOR) 25 MG tablet Take 1 tablet (25 mg total) by mouth 2 (two) times daily.  60 tablet  1  . omeprazole (PRILOSEC) 40 MG capsule Take 1 capsule (40 mg total) by mouth daily.  30 capsule  3  . furosemide (LASIX) 40 MG tablet Take 1 tablet (40 mg total) by mouth daily. For 4 days then stop.  4 tablet  0  . MOVIPREP 100 G SOLR Use per prep instruction  1 kit  0  . potassium chloride SA (K-DUR,KLOR-CON) 20 MEQ tablet Take 1 tablet (20 mEq total) by mouth daily. For 4 days then stop.  4 tablet  0   No current facility-administered medications for this visit.       Physical Exam: BP 167/99  Pulse 66  Resp 20  Ht 5\' 3"  (1.6 m)  Wt 187 lb (84.823 kg)  BMI 33.13 kg/m2  SpO2 97%  General appearance: alert and cooperative Neurologic: intact Heart: regular rate and rhythm, S1, S2 normal, no murmur, click, rub or gallop and normal apical impulse Lungs: clear to auscultation  bilaterally and normal percussion bilaterally Abdomen: soft, non-tender; bowel sounds normal; no masses,  no organomegaly Extremities: extremities normal, atraumatic, no cyanosis or edema and Homans sign is negative, no sign of DVT Wound: Sternum is stable and well-healed  Wounds: Vein harvest sites are healing well he has no pedal edema.  Diagnostic Studies & Laboratory data:         Recent Radiology Findings: Dg Chest 2 View  02/06/2013   CLINICAL DATA:  Coronary artery disease, status post heart surgery 4 weeks ago.  EXAM: CHEST  2 VIEW  COMPARISON:  January 03, 2013  FINDINGS: There is no focal infiltrate, pulmonary edema, or pleural effusion. The mediastinal contour and cardiac silhouette are stable.  The soft tissues and osseous structures are stable.  IMPRESSION: No active cardiopulmonary disease.   Electronically Signed   By: Sherian Rein   On: 02/06/2013 11:16    PREOP CT : Clinical Data: Abdominal pain radiating into chest and back,  evaluate for dissection  CT ANGIOGRAPHY CHEST, ABDOMEN AND PELVIS  Technique: Multidetector CT imaging through the chest, abdomen and  pelvis was performed using the standard protocol during bolus  administration of intravenous contrast. Multiplanar reconstructed  images including MIPs were obtained and reviewed to evaluate the  vascular anatomy.  Contrast: OMNIPAQUE IOHEXOL 350 MG/ML SOLN  Comparison: Chest x-ray 12/27/2012; prior CT abdomen/pelvis  04/22/2008  CTA CHEST  Findings:  Mediastinum: Nonspecific heterogeneous appearance of thyroid gland.  Probable 12 mm nodule in the left thyroid gland is nonspecific by  CT. Mild circumferential thickening of the distal esophageal wall.  No suspicious mediastinal or hilar adenopathy. No mediastinal  mass.  Heart/Vascular: Conventional three-vessel arch anatomy. No acute  intramural hematoma, dissection or evidence of aneurysmal  dilatation. Evaluation of the coronary arteries in cardiac  structures slightly limited by noncardiac gated technique. There  is atherosclerotic calcification along the course of the left  anterior descending coronary artery. The heart is at the upper  limits of normal for size. Focal hypoattenuation of the myocardium  at the inferior ventricular apex with associated thinning suggest  remodelling from prior myocardial infarction. No pericardial  effusion. Unremarkable pulmonary artery. No central embolus.  Lungs/Pleura: Mild dependent atelectasis in the lower lobes. 3 mm  nonspecific nodule in the right middle lobe. A 2.5 mm nodules  identified in the central anterior right lower lobe. The presence  of small multi focal pulmonary nodules is highly likely the    sequelae of prior infection/inflammation/granulomatous process in  the absence of a known primary malignancy. Mild bilateral lower  lobe bronchial wall thickening.  Bones: No acute fracture or aggressive appearing lytic or blastic  osseous lesion.  Review of the MIP images confirms the above findings.  IMPRESSION:  1. Negative for acute aortic pathology or aneurysmal dilatation.  2. Atherosclerosis including coronary artery disease.  3. Borderline cardiomegaly with sequelae suggesting prior remote  myocardial infarction involving the apex and apical inferior wall.  4. Mild circumferential thickening of the distal esophageal wall.  Recommend clinical correlation for signs and symptoms of  gastroesophageal reflux disease.  5. Probable 12 mm left thyroid nodule is nonspecific by CT.  Consider non emergent evaluation with dedicated thyroid ultrasound.  6. Mild bilateral lower lobe bronchial wall thickening as can be  seen in both acute and chronic bronchitis.  7. Several small (2 - 3 mm) pulmonary nodules. In the absence of  a known primary malignancy, these are highly likely the sequela of  a  prior infectious/inflammatory or granulomatous process.  CTA ABDOMEN AND PELVIS  Findings:  VASCULAR  Aorta: No evidence of acute intramural hematoma, aneurysmal  dilatation or dissection. Minimal atherosclerotic vascular disease  in the distal aorta and aortic bifurcation.  Celiac: Widely patent. Conventional hepatic arterial anatomy.  SMA: Widely patent.  Renals: Two right and one left renal arteries are widely patent.  No evidence of changes of fibromuscular dysplasia.  IMA: There may be mild narrowing at the origin. The more distal  vessel is widely patent.  Inflow: Trace atherosclerotic vascular disease without aneurysmal  dilatation or stenosis in the bilateral common iliac arteries.  Veins: Given arterial phase timing evaluation is limited. No focal  abnormality identified.   NON-VASCULAR  Abdomen: Unremarkable CT appearance of the stomach, duodenum,  spleen, adrenal glands and pancreas. Normal hepatic morphology and  contour. No discrete hepatic lesion. Gallbladder is unremarkable.  No intra or extrahepatic biliary ductal dilatation.  Symmetric parenchymal enhancement of the kidneys. No  hydronephrosis or nephrolithiasis. No enhancing renal mass. Tiny  sub centimeter bilateral hypoattenuating lesions are too small to  characterize but statistically likely benign cysts.  Normal-caliber large and small bowel. No evidence of bowel  obstruction. Normal appendix identified in the right lower  quadrant. No free fluid or suspicious adenopathy.  Bones: No acute fracture or aggressive appearing lytic or blastic  osseous lesion. .  Review of the MIP images confirms the above findings.  IMPRESSION:  1. No acute aortic pathology or aneurysmal dilatation.  2. No acute abnormality in the abdomen or pelvis to explain the  patient's clinical symptoms.  3. Mild atherosclerotic vascular disease without significant  stenosis.  Signed,  Sterling Big, MD  Vascular & Interventional Radiologist  [facility]   Recent Labs: Lab Results  Component Value Date   WBC 9.7 01/04/2013   HGB 10.0* 01/04/2013   HCT 30.0* 01/04/2013   PLT 153 01/04/2013   GLUCOSE 139* 01/04/2013   CHOL 176 12/28/2012   TRIG 184* 12/28/2012   HDL 35* 12/28/2012   LDLCALC 104* 12/28/2012   ALT 23 12/27/2012   AST 19 12/27/2012   NA 139 01/04/2013   K 3.9 01/04/2013   CL 105 01/04/2013   CREATININE 1.07 01/04/2013   BUN 14 01/04/2013   CO2 27 01/04/2013   TSH 0.403 12/27/2012   INR 1.27 01/01/2013   HGBA1C 6.3* 12/27/2012      Assessment / Plan:     Patient is status post coronary bypass grafting, TEE showed mild mitral regurgitation Multiple small 2-3 mm pulmonary nodules- plan followup CT scan of the chest in 8 months, he is a previous smoker having quit 14 years ago I discussed with  the patient the need for primary care M.D.,  Patient reminded to get current flu vaccination and  pneumococcal vaccination.       Yaelis Scharfenberg B 02/06/2013 11:25 AM

## 2013-02-07 ENCOUNTER — Other Ambulatory Visit: Payer: Self-pay | Admitting: Physician Assistant

## 2013-02-10 ENCOUNTER — Other Ambulatory Visit: Payer: Self-pay

## 2013-02-10 DIAGNOSIS — G8918 Other acute postprocedural pain: Secondary | ICD-10-CM

## 2013-02-10 MED ORDER — HYDROCODONE-ACETAMINOPHEN 7.5-325 MG PO TABS: 1.0000 | ORAL_TABLET | Freq: Four times a day (QID) | ORAL | Status: DC | PRN

## 2013-02-10 NOTE — Telephone Encounter (Signed)
RX refill for Norco 7.5/325 mg #40 no refill called to pharm.

## 2013-02-17 ENCOUNTER — Other Ambulatory Visit: Payer: Self-pay | Admitting: *Deleted

## 2013-02-17 DIAGNOSIS — G8918 Other acute postprocedural pain: Secondary | ICD-10-CM

## 2013-02-17 MED ORDER — HYDROCODONE-ACETAMINOPHEN 7.5-325 MG PO TABS: 1.0000 | ORAL_TABLET | Freq: Four times a day (QID) | ORAL | Status: DC | PRN

## 2013-02-18 ENCOUNTER — Ambulatory Visit (AMBULATORY_SURGERY_CENTER): Payer: Self-pay | Admitting: Internal Medicine

## 2013-02-18 ENCOUNTER — Telehealth: Payer: Self-pay | Admitting: *Deleted

## 2013-02-18 ENCOUNTER — Encounter: Payer: Self-pay | Admitting: Internal Medicine

## 2013-02-18 VITALS — BP 113/47 | HR 65 | Temp 98.1°F | Resp 19 | Ht 63.0 in | Wt 187.0 lb

## 2013-02-18 DIAGNOSIS — K2990 Gastroduodenitis, unspecified, without bleeding: Secondary | ICD-10-CM

## 2013-02-18 DIAGNOSIS — R933 Abnormal findings on diagnostic imaging of other parts of digestive tract: Secondary | ICD-10-CM

## 2013-02-18 DIAGNOSIS — D126 Benign neoplasm of colon, unspecified: Secondary | ICD-10-CM

## 2013-02-18 DIAGNOSIS — K2970 Gastritis, unspecified, without bleeding: Secondary | ICD-10-CM

## 2013-02-18 DIAGNOSIS — A048 Other specified bacterial intestinal infections: Secondary | ICD-10-CM

## 2013-02-18 DIAGNOSIS — K228 Other specified diseases of esophagus: Secondary | ICD-10-CM

## 2013-02-18 DIAGNOSIS — Z1211 Encounter for screening for malignant neoplasm of colon: Secondary | ICD-10-CM

## 2013-02-18 MED ORDER — SODIUM CHLORIDE 0.9 % IV SOLN: 500.0000 mL | INTRAVENOUS | Status: DC

## 2013-02-18 NOTE — Patient Instructions (Addendum)
YOU HAD AN ENDOSCOPIC PROCEDURE TODAY AT THE [facility]: Refer to the procedure report that was given to you for any specific questions about what was found during the examination.  If the procedure report does not answer your questions, please call your gastroenterologist to clarify.  If you requested that your care partner not be given the details of your procedure findings, then the procedure report has been included in a sealed envelope for you to review at your convenience later.  YOU SHOULD EXPECT: Some feelings of bloating in the abdomen. Passage of more gas than usual.  Walking can help get rid of the air that was put into your GI tract during the procedure and reduce the bloating. If you had a lower endoscopy (such as a colonoscopy or flexible sigmoidoscopy) you may notice spotting of blood in your stool or on the toilet paper. If you underwent a bowel prep for your procedure, then you may not have a normal bowel movement for a few days.  DIET: Your first meal following the procedure should be a light meal and then it is ok to progress to your normal diet.  A half-sandwich or bowl of soup is an example of a good first meal.  Heavy or fried foods are harder to digest and may make you feel nauseous or bloated.  Likewise meals heavy in dairy and vegetables can cause extra gas to form and this can also increase the bloating.  Drink plenty of fluids but you should avoid alcoholic beverages for 24 hours.  ACTIVITY: Your care partner should take you home directly after the procedure.  You should plan to take it easy, moving slowly for the rest of the day.  You can resume normal activity the day after the procedure however you should NOT DRIVE or use heavy machinery for 24 hours (because of the sedation medicines used during the test).    SYMPTOMS TO REPORT IMMEDIATELY: A gastroenterologist can be reached at any hour.  During normal business hours, 8:30 AM to 5:00 PM Monday through Friday,  call [phone].  After hours and on weekends, please call the GI answering service at [phone] who will take a message and have the physician on call contact you.  PLEASE, HOLD ALL ASPIRIN PRODUCTS AND NSAIDS(ALEVE, IBUPROFEN ETC.) FOR 1 WEEK TO PREVENT BLEEDING.    Following lower endoscopy (colonoscopy or flexible sigmoidoscopy):  Excessive amounts of blood in the stool  Significant tenderness or worsening of abdominal pains  Swelling of the abdomen that is new, acute  Fever of 100F or higher  Following upper endoscopy (EGD)  Vomiting of blood or coffee ground material  New chest pain or pain under the shoulder blades  Painful or persistently difficult swallowing  New shortness of breath  Fever of 100F or higher  Black, tarry-looking stools  FOLLOW UP: If any biopsies were taken you will be contacted by phone or by letter within the next 1-3 weeks.  Call your gastroenterologist if you have not heard about the biopsies in 3 weeks.  Our staff will call the home number listed on your records the next business day following your procedure to check on you and address any questions or concerns that you may have at that time regarding the information given to you following your procedure. This is a courtesy call and so if there is no answer at the home number and we have not heard from you through the emergency physician on call, we will assume  that you have returned to your regular daily activities without incident.  We will call you if you have the h-pylori infection.  SIGNATURES/CONFIDENTIALITY: You and/or your care partner have signed paperwork which will be entered into your electronic medical record.  These signatures attest to the fact that that the information above on your After Visit Summary has been reviewed and is understood.  Full responsibility of the confidentiality of this discharge information lies with you and/or your care-partner.

## 2013-02-18 NOTE — Progress Notes (Addendum)
Patient did not have preoperative order for IV antibiotic SSI prophylaxis. (G8918)  Patient did not experience any of the following events: a burn prior to discharge; a fall within the facility; wrong site/side/patient/procedure/implant event; or a hospital transfer or hospital admission upon discharge from the facility. (G8907)  

## 2013-02-18 NOTE — Op Note (Signed)
[facility] [address] [place], [address]   ENDOSCOPY PROCEDURE REPORT  PATIENT: Nathan, Buckley  MR#: [id number] BIRTHDATE: May 23, 1960 , 51  yrs. old GENDER: Male ENDOSCOPIST: Beverley Fiedler, MD REFERRED BY:  Nilda Simmer, M.D. PROCEDURE DATE:  02/18/2013 PROCEDURE:  EGD w/ biopsy ASA CLASS:     Class III INDICATIONS:  abnormal CT of the GI tract (CT scan with esophageal thickening). MEDICATIONS: MAC sedation, administered by CRNA and Propofol (Diprivan) 260 mg IV TOPICAL ANESTHETIC: none  DESCRIPTION OF PROCEDURE: After the risks benefits and alternatives of the procedure were thoroughly explained, informed consent was obtained.  The LB [id number] endoscope was introduced through the mouth and advanced to the second portion of the duodenum. Without limitations.  The instrument was slowly withdrawn as the mucosa was fully examined.     ESOPHAGUS: The mucosa of the esophagus appeared normal.   A normal Z-line was observed 40 cm from the incisors.  STOMACH: There was mild antral gastropathy noted.  Cold forcep biopsies were taken at the antrum and angularis.  DUODENUM: The duodenal mucosa showed no abnormalities in the bulb and second portion of the duodenum. Retroflexed views revealed no abnormalities.     The scope was then withdrawn from the patient and the procedure completed.  COMPLICATIONS: There were no complications.  ENDOSCOPIC IMPRESSION: 1.   The mucosa of the esophagus appeared normal 2.   Normal Z-line was observed 40 cm from the incisors 3.   There was mild antral gastropathy noted; biopsies to exclude H. pylori 4.   The duodenal mucosa showed no abnormalities in the bulb and second portion of the duodenum  RECOMMENDATIONS: 1.  Await pathology results 2.  Follow-up of helicobacter pylori status, treat if indicated  eSigned:  Beverley Fiedler, MD 02/18/2013 3:10 PM  CC:The Patient and Nilda Simmer, MD

## 2013-02-18 NOTE — Op Note (Signed)
[facility] [address] [place], [address]   COLONOSCOPY PROCEDURE REPORT  PATIENT: Nathan Buckley, Nathan Buckley  MR#: [id number] BIRTHDATE: 11-24-1960 , 51  yrs. old GENDER: Male ENDOSCOPIST: Beverley Fiedler, MD PROCEDURE DATE:  02/18/2013 PROCEDURE:   Colonoscopy with cold biopsy polypectomy and Colonoscopy with snare polypectomy First Screening Colonoscopy - Avg.  risk and is 50 yrs.  old or older Yes.  Prior Negative Screening - Now for repeat screening. N/A  History of Adenoma - Now for follow-up colonoscopy & has been > or = to 3 yrs.  N/A  Polyps Removed Today? Yes. ASA CLASS:   Class III INDICATIONS:average risk screening and first colonoscopy. MEDICATIONS: MAC sedation, administered by CRNA and Propofol (Diprivan) 260 mg IV  DESCRIPTION OF PROCEDURE:   After the risks benefits and alternatives of the procedure were thoroughly explained, informed consent was obtained.  A digital rectal exam revealed no rectal mass.   The LB [id number]  endoscope was introduced through the anus and advanced to the cecum, which was identified by both the appendix and ileocecal valve. No adverse events experienced. The quality of the prep was good, using MoviPrep  The instrument was then slowly withdrawn as the colon was fully examined.  COLON FINDINGS: Three sessile polyps measuring 2-7 mm in size were found in the transverse colon (2) and descending colon. Polypectomy was performed with cold forceps (1), using cold snare (1) and using hot snare (1 in descending).  All resections were complete and all polyp tissue was completely retrieved.   The colon mucosa was otherwise normal.  Retroflexed views revealed small external hemorrhoids. The time to cecum=2 minutes 18 seconds. Withdrawal time=15 minutes 32 seconds.  The scope was withdrawn and the procedure completed. COMPLICATIONS: There were no complications.  ENDOSCOPIC IMPRESSION: 1.   Three sessile polyps  measuring 2-7 mm in size were found in the transverse colon and descending colon; Polypectomy was performed with cold forceps, using cold snare and using hot snare 2.   The colon mucosa was otherwise normal  RECOMMENDATIONS: 1.  Hold aspirin, aspirin products, and anti-inflammatory medication for 1 week. 2.  Await pathology results 3.  If the polyps removed today are proven to be adenomatous (pre-cancerous) polyps, you will need a colonoscopy in 3 years. Otherwise you should continue to follow colorectal cancer screening guidelines for "routine risk" patients with a colonoscopy in 10 years.  You will receive a letter within 1-2 weeks with the results of your biopsy as well as final recommendations.  Please call my office if you have not received a letter after 3 weeks.   eSigned:  Beverley Fiedler, MD 02/18/2013 3:14 PM cc: The Patient and Nilda Simmer, MD

## 2013-02-18 NOTE — Progress Notes (Signed)
Called to room to assist during endoscopic procedure.  Patient ID and intended procedure confirmed with present staff. Received instructions for my participation in the procedure from the performing physician.  

## 2013-02-18 NOTE — Telephone Encounter (Signed)
Patient has not shown for appointment scheduled for 130 pm, expected arrival at 1230 pm. Patients phone number listed has been disconnected. Phoned patients wife who states patient is supposed to be on his way here but he is driving himself. She states she has no way to reach him, his cell phone is not working.

## 2013-02-18 NOTE — Progress Notes (Signed)
Report to pacu rn, vss, bbs=clear 

## 2013-02-19 ENCOUNTER — Telehealth: Payer: Self-pay | Admitting: *Deleted

## 2013-02-19 NOTE — Telephone Encounter (Signed)
Attempted to call x3,unable to leave message no voicemail.

## 2013-02-24 ENCOUNTER — Encounter: Payer: Self-pay | Admitting: Internal Medicine

## 2013-02-25 ENCOUNTER — Telehealth: Payer: Self-pay | Admitting: *Deleted

## 2013-02-25 ENCOUNTER — Other Ambulatory Visit: Payer: Self-pay | Admitting: *Deleted

## 2013-02-25 DIAGNOSIS — G8918 Other acute postprocedural pain: Secondary | ICD-10-CM

## 2013-02-25 MED ORDER — HYDROCODONE-ACETAMINOPHEN 7.5-325 MG PO TABS: 1.0000 | ORAL_TABLET | Freq: Three times a day (TID) | ORAL | Status: DC | PRN

## 2013-02-25 MED ORDER — AMOXICILL-CLARITHRO-LANSOPRAZ PO MISC: Freq: Two times a day (BID) | ORAL | Status: DC

## 2013-02-25 NOTE — Telephone Encounter (Signed)
Explained to pt that he needs an AB for + H. Pylori. Ordered PrevPak for the pt and instructed him to call if he had problems purchasing the med and he stated understanding. Also explained we will do a f/u test to confirm eradication. Reminder in

## 2013-02-25 NOTE — Telephone Encounter (Signed)
I discussed Nathan Buckley's pain med usage with his daughter.  He has been requesting a refill almost every 7 days without fail.  I explained that he needs to start trying to wean himself by using ES Tylenol in between dosages or an anti inflammatory if he can tolerate theses.  Also I suggested he only use the narcotic every 8 hours and his RX should last 2 weeks.  If his pain continues to be of a nature that he continues to need large amounts of medication, he will need to return to see the surgeon.  She understands.

## 2013-02-25 NOTE — Telephone Encounter (Signed)
Message copied by Florene Glen on Tue Feb 25, 2013  3:54 PM ------      Message from: Beverley Fiedler      Created: Mon Feb 24, 2013  9:21 PM       H pylori POSITIVE      Please treat with PrevPak or Pylera plus BID PPI      Confirm eradication with stool ag for breath test 8 weeks after treatment, off PPI ------

## 2013-04-28 ENCOUNTER — Telehealth: Payer: Self-pay | Admitting: *Deleted

## 2013-04-28 NOTE — Telephone Encounter (Signed)
Pt needs H. Pylori Breath test.

## 2013-04-28 NOTE — Telephone Encounter (Signed)
Message copied by Florene Glen on Mon Apr 28, 2013  8:36 AM ------      Message from: Florene Glen      Created: Tue Feb 25, 2013  4:19 PM       ------           Message from: Beverley Fiedler           Created: Mon Feb 24, 2013  9:21 PM                  H pylori POSITIVE           Please treat with PrevPak or Pylera plus BID PPI           Confirm eradication with stool ag for breath test 8 weeks after treatment, off PPI      ------check date for med start ------

## 2013-05-06 NOTE — Telephone Encounter (Signed)
lmom for pt to call back to schedule a Breath Test.

## 2013-05-28 NOTE — Telephone Encounter (Signed)
Wrote pt a letter requesting he call to schedule a Breath Test.

## 2013-06-27 ENCOUNTER — Telehealth: Payer: Self-pay | Admitting: *Deleted

## 2013-06-27 NOTE — Telephone Encounter (Signed)
Mailed another letter to pt requesting he call about scheduling a Breath Test. Will put a reminder in

## 2013-06-27 NOTE — Telephone Encounter (Signed)
Message copied by Lance Morin on Fri Jun 27, 2013  2:24 PM ------      Message from: Lance Morin      Created: Fri May 30, 2013 11:23 AM       See if pt responded to letter about scheduling a breath test for h. Pylori. ------

## 2013-08-21 ENCOUNTER — Other Ambulatory Visit: Payer: Self-pay | Admitting: *Deleted

## 2013-08-21 DIAGNOSIS — R911 Solitary pulmonary nodule: Secondary | ICD-10-CM

## 2013-10-09 ENCOUNTER — Ambulatory Visit (INDEPENDENT_AMBULATORY_CARE_PROVIDER_SITE_OTHER): Payer: BC Managed Care – PPO | Admitting: Cardiovascular Disease

## 2013-10-09 ENCOUNTER — Encounter: Payer: Self-pay | Admitting: Cardiothoracic Surgery

## 2013-10-09 ENCOUNTER — Encounter: Payer: Self-pay | Admitting: Cardiovascular Disease

## 2013-10-09 ENCOUNTER — Ambulatory Visit
Admission: RE | Admit: 2013-10-09 | Discharge: 2013-10-09 | Disposition: A | Discharge status: Home / Self Care | Payer: BC Managed Care – PPO | Source: Ambulatory Visit | Attending: Cardiothoracic Surgery | Admitting: Cardiothoracic Surgery

## 2013-10-09 ENCOUNTER — Ambulatory Visit (INDEPENDENT_AMBULATORY_CARE_PROVIDER_SITE_OTHER): Payer: BC Managed Care – PPO | Admitting: Cardiothoracic Surgery

## 2013-10-09 VITALS — BP 176/106 | HR 72 | Resp 16 | Ht 65.0 in | Wt 192.8 lb

## 2013-10-09 VITALS — BP 177/115 | HR 60 | Resp 16 | Ht 63.0 in | Wt 187.0 lb

## 2013-10-09 DIAGNOSIS — Z951 Presence of aortocoronary bypass graft: Secondary | ICD-10-CM

## 2013-10-09 DIAGNOSIS — I2510 Atherosclerotic heart disease of native coronary artery without angina pectoris: Secondary | ICD-10-CM

## 2013-10-09 DIAGNOSIS — R911 Solitary pulmonary nodule: Secondary | ICD-10-CM

## 2013-10-09 DIAGNOSIS — I10 Essential (primary) hypertension: Secondary | ICD-10-CM

## 2013-10-09 DIAGNOSIS — R918 Other nonspecific abnormal finding of lung field: Secondary | ICD-10-CM

## 2013-10-09 DIAGNOSIS — Z79899 Other long term (current) drug therapy: Secondary | ICD-10-CM

## 2013-10-09 DIAGNOSIS — L989 Disorder of the skin and subcutaneous tissue, unspecified: Secondary | ICD-10-CM

## 2013-10-09 DIAGNOSIS — E785 Hyperlipidemia, unspecified: Secondary | ICD-10-CM

## 2013-10-09 MED ORDER — LISINOPRIL 2.5 MG PO TABS: 2.5000 mg | ORAL_TABLET | Freq: Every day | ORAL | Status: DC

## 2013-10-09 MED ORDER — METOPROLOL TARTRATE 25 MG PO TABS: 25.0000 mg | ORAL_TABLET | Freq: Two times a day (BID) | ORAL | Status: DC

## 2013-10-09 MED ORDER — ATORVASTATIN CALCIUM 10 MG PO TABS: 10.0000 mg | ORAL_TABLET | Freq: Every day | ORAL | Status: DC

## 2013-10-09 NOTE — Progress Notes (Signed)
Nathan Buckley       , [id number]             [phone]                     [place] Record #[id number] Date of Birth: May 18, 1960  Sanda Klein, MD No PCP Per Patient  Chief Complaint:   PostOp Follow Up Visit 01/01/2013  PREOPERATIVE DIAGNOSIS: Coronary occlusive disease with unstable  angina.  POSTOPERATIVE DIAGNOSIS: Coronary occlusive disease with unstable  angina.  SURGICAL PROCEDURE: Coronary artery bypass grafting x4 with the left  internal mammary to the left anterior descending coronary artery,  reverse saphenous vein graft to the first diagonal coronary artery,  reverse saphenous vein graft to the distal circumflex coronary artery,  reverse saphenous vein graft to the posterior descending coronary artery  with right thigh and leg Endovein harvesting and TEE.  SURGEON: Lanelle Bal, MD   History of Present Illness:     Patient returns to the office today after recent coronary artery bypass grafting in October. He presented to the [facility] cone with no previous primary care doctor complaining of abdominal and chest pain. CT scan of the abdomen showed several small 3 mm pulmonary nodules, thyroid nodule, thickening of the distal esophagus. Ultimately he was found to have significant coronary artery disease by cardiac catheterization and underwent coronary artery bypass grafting on 8/ 20. He returns today for a followup CT scan of the chest to ensure that the lung nodules have not changed. Since his surgery in August he has not had an appointment with cardiology, his medications were not renew, is in the office today with a blood pressure of 117/115.   He did show up for his appointment today on time and had his CT scan of the chest done.  He was seen by GI following his bypass surgery and underwent colonoscopy and upper GI endoscopy, was positive for H. Pylori.   The patient return to full-time work  in November and hasn't been working full-time since.   History  Smoking status  . Former Smoker  . Quit date: 05/15/1998  Smokeless tobacco  . Never Used       No Known Allergies  Current Outpatient Prescriptions  Medication Sig Dispense Refill  . amoxicillin-clarithromycin-lansoprazole (PREVPAC) combo pack Take by mouth 2 (two) times daily. Follow package directions.  1 kit  0  . aspirin EC 325 MG EC tablet Take 1 tablet (325 mg total) by mouth daily.  30 tablet  0  . atorvastatin (LIPITOR) 10 MG tablet Take 1 tablet (10 mg total) by mouth daily at 6 PM.  30 tablet  1  . furosemide (LASIX) 40 MG tablet       . HYDROcodone-acetaminophen (NORCO) 7.5-325 MG per tablet Take 1 tablet by mouth every 8 (eight) hours as needed for pain.  40 tablet  0  . lisinopril (PRINIVIL,ZESTRIL) 2.5 MG tablet Take 1 tablet (2.5 mg total) by mouth daily.  30 tablet  1  . metoprolol tartrate (LOPRESSOR) 25 MG tablet Take 1 tablet (25 mg total) by mouth 2 (two) times daily.  60 tablet  1  . omeprazole (PRILOSEC) 40 MG capsule       . potassium chloride SA (K-DUR,KLOR-CON) 20 MEQ tablet        No current facility-administered medications for this visit.       Physical Exam: BP 177/115  Pulse 60  Resp 16  Ht '5\' 3"'  (1.6 m)  Wt 187 lb (84.823 kg)  BMI 33.13 kg/m2  SpO2 96%  General appearance: alert and cooperative Neurologic: intact Heart: regular rate and rhythm, S1, S2 normal, no murmur, click, rub or gallop and normal apical impulse Lungs: clear to auscultation bilaterally and normal percussion bilaterally Abdomen: soft, non-tender; bowel sounds normal; no masses,  no organomegaly Extremities: extremities normal, atraumatic, no cyanosis or edema and Homans sign is negative, no sign of DVT Wound: Sternum is stable and well-healed  Wounds: Vein harvest sites are healing well he has no pedal edema. He does have a lesion on his right leg that he notes is been present for 2 months see  photo    Diagnostic Studies & Laboratory data:         Recent Radiology Findings:  Ct Chest Wo Contrast  10/09/2013   CLINICAL DATA:  Follow up pulmonary nodules demonstrated on chest CTA. Ex-smoker. History of open heart surgery.  EXAM: CT CHEST WITHOUT CONTRAST  TECHNIQUE: Multidetector CT imaging of the chest was performed following the standard protocol without IV contrast.  COMPARISON:  Chest CTA 12/27/2012.  FINDINGS: Mild thyroid nodularity appears grossly stable. The heart and great vessels appear stable status post median sternotomy and CABG. There is a small hiatal hernia. No enlarged mediastinal, hilar or axillary lymph nodes are identified.  There is no pleural or pericardial effusion. Previously noted 3 mm right middle and lower lobe pulmonary nodules are stable, best seen on image 32. There is also a 3 mm right upper lobe nodule on image 17 which is stable as well. There are no new or enlarging pulmonary nodules.  Images through the upper abdomen demonstrate hepatic steatosis. There is a low-density lesion in the right hepatic lobe on image 54 which has slightly enlarged from the prior study. However, this measures near water density and is probably a cyst. There are no worrisome osseous findings.  IMPRESSION: 1. Small right-sided pulmonary nodules are stable. Again, these are highly likely the sequela of prior infectious or inflammatory process. No enlarging nodules demonstrated. 2. No acute chest findings. 3. Mild hepatic steatosis. Probable slight enlargement of small hepatic cyst.   Electronically Signed   By: Camie Patience M.D.   On: 10/09/2013 10:51    PREOP CT : Clinical Data: Abdominal pain radiating into chest and back,  evaluate for dissection  CT ANGIOGRAPHY CHEST, ABDOMEN AND PELVIS  Technique: Multidetector CT imaging through the chest, abdomen and  pelvis was performed using the standard protocol during bolus  administration of intravenous contrast. Multiplanar  reconstructed  images including MIPs were obtained and reviewed to evaluate the  vascular anatomy.  Contrast: 193m OMNIPAQUE IOHEXOL 350 MG/ML SOLN  Comparison: Chest x-ray 12/27/2012; prior CT abdomen/pelvis  04/22/2008  CTA CHEST  Findings:  Mediastinum: Nonspecific heterogeneous appearance of thyroid gland.  Probable 12 mm nodule in the left thyroid gland is nonspecific by  CT. Mild circumferential thickening of the distal esophageal wall.  No suspicious mediastinal or hilar adenopathy. No mediastinal  mass.  Heart/Vascular: Conventional three-vessel arch anatomy. No acute  intramural hematoma, dissection or evidence of aneurysmal  dilatation. Evaluation of the coronary arteries in cardiac  structures slightly limited by noncardiac gated technique. There  is atherosclerotic calcification along the course of the left  anterior descending coronary artery. The heart is at the upper  limits of normal for size. Focal hypoattenuation of the myocardium  at the inferior ventricular apex with associated thinning  suggest  remodelling from prior myocardial infarction. No pericardial  effusion. Unremarkable pulmonary artery. No central embolus.  Lungs/Pleura: Mild dependent atelectasis in the lower lobes. 3 mm  nonspecific nodule in the right middle lobe. A 2.5 mm nodules  identified in the central anterior right lower lobe. The presence  of small multi focal pulmonary nodules is highly likely the  sequelae of prior infection/inflammation/granulomatous process in  the absence of a known primary malignancy. Mild bilateral lower  lobe bronchial wall thickening.  Bones: No acute fracture or aggressive appearing lytic or blastic  osseous lesion.  Review of the MIP images confirms the above findings.  IMPRESSION:  1. Negative for acute aortic pathology or aneurysmal dilatation.  2. Atherosclerosis including coronary artery disease.  3. Borderline cardiomegaly with sequelae suggesting prior  remote  myocardial infarction involving the apex and apical inferior wall.  4. Mild circumferential thickening of the distal esophageal wall.  Recommend clinical correlation for signs and symptoms of  gastroesophageal reflux disease.  5. Probable 12 mm left thyroid nodule is nonspecific by CT.  Consider non emergent evaluation with dedicated thyroid ultrasound.  6. Mild bilateral lower lobe bronchial wall thickening as can be  seen in both acute and chronic bronchitis.  7. Several small (2 - 3 mm) pulmonary nodules. In the absence of  a known primary malignancy, these are highly likely the sequela of  a prior infectious/inflammatory or granulomatous process.  CTA ABDOMEN AND PELVIS  Findings:  VASCULAR  Aorta: No evidence of acute intramural hematoma, aneurysmal  dilatation or dissection. Minimal atherosclerotic vascular disease  in the distal aorta and aortic bifurcation.  Celiac: Widely patent. Conventional hepatic arterial anatomy.  SMA: Widely patent.  Renals: Two right and one left renal arteries are widely patent.  No evidence of changes of fibromuscular dysplasia.  IMA: There may be mild narrowing at the origin. The more distal  vessel is widely patent.  Inflow: Trace atherosclerotic vascular disease without aneurysmal  dilatation or stenosis in the bilateral common iliac arteries.  Veins: Given arterial phase timing evaluation is limited. No focal  abnormality identified.  NON-VASCULAR  Abdomen: Unremarkable CT appearance of the stomach, duodenum,  spleen, adrenal glands and pancreas. Normal hepatic morphology and  contour. No discrete hepatic lesion. Gallbladder is unremarkable.  No intra or extrahepatic biliary ductal dilatation.  Symmetric parenchymal enhancement of the kidneys. No  hydronephrosis or nephrolithiasis. No enhancing renal mass. Tiny  sub centimeter bilateral hypoattenuating lesions are too small to  characterize but statistically likely benign cysts.   Normal-caliber large and small bowel. No evidence of bowel  obstruction. Normal appendix identified in the right lower  quadrant. No free fluid or suspicious adenopathy.  Bones: No acute fracture or aggressive appearing lytic or blastic  osseous lesion. .  Review of the MIP images confirms the above findings.  IMPRESSION:  1. No acute aortic pathology or aneurysmal dilatation.  2. No acute abnormality in the abdomen or pelvis to explain the  patient's clinical symptoms.  3. Mild atherosclerotic vascular disease without significant  stenosis.  Signed,  Criselda Peaches, MD  Vascular & Interventional Radiologist  [facility]   Recent Labs: Lab Results  Component Value Date   WBC 9.7 01/04/2013   HGB 10.0* 01/04/2013   HCT 30.0* 01/04/2013   PLT 153 01/04/2013   GLUCOSE 139* 01/04/2013   CHOL 176 12/28/2012   TRIG 184* 12/28/2012   HDL 35* 12/28/2012   LDLCALC 104* 12/28/2012   ALT 23 12/27/2012  AST 19 12/27/2012   NA 139 01/04/2013   K 3.9 01/04/2013   CL 105 01/04/2013   CREATININE 1.07 01/04/2013   BUN 14 01/04/2013   CO2 27 01/04/2013   TSH 0.403 12/27/2012   INR 1.27 01/01/2013   HGBA1C 6.3* 12/27/2012      Assessment / Plan:    Patient is status post coronary bypass grafting, Multiple small 2-3 mm pulmonary nodules- plan followup CT scan of the chest stable over 8 months likely benign,  he is a previous smoker having quit 14 years ago Will plan followup CT scan the chest one year to followup the small nodules Referral to dermatology for the right leg lesion Have contacted cardiology for an appointment today to get the patient established back on the appropriate cardiac medications/blood pressure medications   Grace Isaac 10/09/2013 11:01 AM

## 2013-10-09 NOTE — Patient Instructions (Addendum)
Your physician recommends that you schedule a follow-up appointment in: 3 Months  Your physician recommends that you return for lab work in: [place] in 3 Months  Your physician has recommended you make the following change in your medication: Start 81 mg Aspirin, Metoprolol 25 mg twice daily, lisinopril 2.5 mg daily and Atorvastatin 10 mg daily   You have been referred to Dermatologist

## 2013-10-11 ENCOUNTER — Encounter: Payer: Self-pay | Admitting: Cardiovascular Disease

## 2013-10-11 DIAGNOSIS — L989 Disorder of the skin and subcutaneous tissue, unspecified: Secondary | ICD-10-CM | POA: Insufficient documentation

## 2013-10-11 NOTE — Assessment & Plan Note (Addendum)
Currently asymptomatic. Need to restart treating all his coronary risk factors. Resume the beta blocker and ACE inhibitor he was taking last year as well as a statin and reevaluate in a few weeks.

## 2013-10-11 NOTE — Progress Notes (Signed)
Patient ID: Nathan Buckley, male   DOB: 1960-09-29, 53 y.o.   MRN: [id number]      Reason for office visit CAD s/p CABG, HTN  This 53 year old [place] man underwent multivessel bypass surgery in August of 2014. He has hypertension and hyperlipidemia. He missed his appointment with cardiology followup and has run out of all of his medications. Incidentally discovered pulmonary nodules are followed up with a CT scan of the chest and he saw Dr. Servando Snare a few days ago. He was markedly hypertensive and is referred back to [place] for followup.  Has no cardiovascular complaints. He describes an enlarging minimally pruritic lesion on his anteromedial right shin which has not improved with treatment with over-the-counter corticosteroids and antifungal topical medications. .  No Known Allergies  Current Outpatient Prescriptions  Medication Sig Dispense Refill  . acetaminophen (TYLENOL) 500 MG tablet Take 1,000 mg by mouth every 6 (six) hours as needed.      Marland Kitchen aspirin 81 MG tablet Take 81 mg by mouth daily.      Marland Kitchen atorvastatin (LIPITOR) 10 MG tablet Take 1 tablet (10 mg total) by mouth daily.  30 tablet  10  . lisinopril (PRINIVIL,ZESTRIL) 2.5 MG tablet Take 1 tablet (2.5 mg total) by mouth daily.  30 tablet  10  . metoprolol tartrate (LOPRESSOR) 25 MG tablet Take 1 tablet (25 mg total) by mouth 2 (two) times daily.  30 tablet  10   No current facility-administered medications for this visit.    Past Medical History  Diagnosis Date  . Allergy   . Hypertension   . HLD (hyperlipidemia)   . Hypokalemia   . Cardiomyopathy   . Chronic kidney disease     kidney stone  . Myocardial infarction     Past Surgical History  Procedure Laterality Date  . Coronary artery bypass graft N/A 01/01/2013    Procedure: CORONARY ARTERY BYPASS GRAFTING times four using Right Greater Saphenous Vein Graft harvested endoscopically and Left Internal Mammary Artery;  Surgeon: Grace Isaac, MD;   Location: [place];  Service: Open Heart Surgery;  Laterality: N/A;  . Intraoperative transesophageal echocardiogram N/A 01/01/2013    Procedure: INTRAOPERATIVE TRANSESOPHAGEAL ECHOCARDIOGRAM;  Surgeon: Grace Isaac, MD;  Location: [place];  Service: Open Heart Surgery;  Laterality: N/A;    Family History  Problem Relation Age of Onset  . Colon cancer Neg Hx   . Esophageal cancer Neg Hx   . Rectal cancer Neg Hx   . Stomach cancer Neg Hx     History   Social History  . Marital Status: Married    Spouse Name: N/A    Number of Children: 3  . Years of Education: N/A   Occupational History  . MACHINE OPERATOR    Social History Main Topics  . Smoking status: Former Smoker    Quit date: 05/15/1998  . Smokeless tobacco: Never Used  . Alcohol Use: Yes     Comment: rarely- beer once a month  . Drug Use: No  . Sexual Activity: Not on file   Other Topics Concern  . Not on file   Social History Narrative  . No narrative on file    Review of systems: The patient specifically denies any chest pain at rest or with exertion, dyspnea at rest or with exertion, orthopnea, paroxysmal nocturnal dyspnea, syncope, palpitations, focal neurological deficits, intermittent claudication, lower extremity edema, unexplained weight gain, cough, hemoptysis or wheezing.  The patient also denies abdominal pain, nausea, vomiting, dysphagia,  diarrhea, constipation, polyuria, polydipsia, dysuria, hematuria, frequency, urgency, abnormal bleeding or bruising, fever, chills, unexpected weight changes, mood swings, change in skin or hair texture, change in voice quality, auditory or visual problems, allergic reactions or rashes, new musculoskeletal complaints other than usual "aches and pains".   PHYSICAL EXAM BP 176/106  Pulse 72  Resp 16  Ht _0  (1.651 m)  Wt 192 lb 12.8 oz (87.454 kg)  BMI 32.08 kg/m2  General: Alert, oriented x3, no distress Head: no evidence of trauma, PERRL, EOMI, no exophtalmos  or lid lag, no myxedema, no xanthelasma; normal ears, nose and oropharynx Neck: normal jugular venous pulsations and no hepatojugular reflux; brisk carotid pulses without delay and no carotid bruits Chest: clear to auscultation, no signs of consolidation by percussion or palpation, normal fremitus, symmetrical and full respiratory excursions Cardiovascular: normal position and quality of the apical impulse, regular rhythm, normal first and second heart sounds, no murmurs, rubs or gallops Abdomen: no tenderness or distention, no masses by palpation, no abnormal pulsatility or arterial bruits, normal bowel sounds, no hepatosplenomegaly Extremities: no clubbing, cyanosis or edema; 2+ radial, ulnar and brachial pulses bilaterally; 2+ right femoral, posterior tibial and dorsalis pedis pulses; 2+ left femoral, posterior tibial and dorsalis pedis pulses; no subclavian or femoral bruits There is a roughly 10 cm ovoid anteromedial right shin plaque with thin erythematous and serpiginous margins and a hyperpigmented slightly scaly center. It bleeds easily  If he scratches it. Neurological: grossly nonfocal   EKG: Normal sinus rhythm, Q waves of an old inferior infarction, unchanged from last years tracings  Lipid Panel     Component Value Date/Time   CHOL 176 12/28/2012 0500   TRIG 184* 12/28/2012 0500   HDL 35* 12/28/2012 0500   CHOLHDL 5.0 12/28/2012 0500   VLDL 37 12/28/2012 0500   LDLCALC 104* 12/28/2012 0500    BMET    Component Value Date/Time   NA 139 01/04/2013 0433   K 3.9 01/04/2013 0433   CL 105 01/04/2013 0433   CO2 27 01/04/2013 0433   GLUCOSE 139* 01/04/2013 0433   BUN 14 01/04/2013 0433   CREATININE 1.07 01/04/2013 0433   CREATININE 1.11 12/26/2012 1355   CALCIUM 8.4 01/04/2013 0433   GFRNONAA 77* 01/04/2013 0433   GFRAA 90* 01/04/2013 0433     ASSESSMENT AND PLAN S/P CABG x 4: LIMA-LAD, SVG-D1, SVG-Cx, SVG-RCA Currently asymptomatic. Need to restart treating all his coronary risk  factors. Resume the beta blocker and ACE inhibitor he was taking last year as well as a statin and reevaluate in a few weeks.  Skin lesion of right leg This is not related to his surgical scars. It does not have the typical appearance of a venous stasis ulcer despite the fact of its location might suggest this. There is no evidence of varicose veins. It has not responded to antifungal this. Needs to see a dermatologist and will make the necessary referral.   Orders Placed This Encounter  Procedures  . Lipid Profile  . Comp Met (CMET)  . Ambulatory referral to Dermatology  . EKG 12-Lead   Meds ordered this encounter  Medications  . acetaminophen (TYLENOL) 500 MG tablet    Sig: Take 1,000 mg by mouth every 6 (six) hours as needed.  Marland Kitchen DISCONTD: metoprolol tartrate (LOPRESSOR) 25 MG tablet    Sig: Take 25 mg by mouth 2 (two) times daily.  Marland Kitchen DISCONTD: lisinopril (PRINIVIL,ZESTRIL) 2.5 MG tablet    Sig: Take 2.5 mg by mouth  daily.  Marland Kitchen DISCONTD: atorvastatin (LIPITOR) 10 MG tablet    Sig: Take 10 mg by mouth daily.  Marland Kitchen aspirin 81 MG tablet    Sig: Take 81 mg by mouth daily.  Marland Kitchen atorvastatin (LIPITOR) 10 MG tablet    Sig: Take 1 tablet (10 mg total) by mouth daily.    Dispense:  30 tablet    Refill:  10  . lisinopril (PRINIVIL,ZESTRIL) 2.5 MG tablet    Sig: Take 1 tablet (2.5 mg total) by mouth daily.    Dispense:  30 tablet    Refill:  10  . metoprolol tartrate (LOPRESSOR) 25 MG tablet    Sig: Take 1 tablet (25 mg total) by mouth 2 (two) times daily.    Dispense:  30 tablet    Refill:  [address] Walter Min  Sanda Klein, MD, [facility] [phone] office [phone] pager

## 2013-10-11 NOTE — Assessment & Plan Note (Deleted)
Resume the medications that he was on last year.

## 2013-10-11 NOTE — Assessment & Plan Note (Signed)
This is not related to his surgical scars. It does not have the typical appearance of a venous stasis ulcer despite the fact of its location might suggest this. There is no evidence of varicose veins. It has not responded to antifungal this. Needs to see a dermatologist and will make the necessary referral.

## 2013-10-13 ENCOUNTER — Telehealth: Payer: Self-pay | Admitting: Cardiovascular Disease

## 2013-10-13 NOTE — Telephone Encounter (Signed)
Made appt for pt to see Dr. Elvera Lennox on August 10 @ 9:50.

## 2013-11-26 ENCOUNTER — Telehealth: Payer: Self-pay | Admitting: *Deleted

## 2013-11-26 ENCOUNTER — Encounter: Payer: Self-pay | Admitting: *Deleted

## 2013-11-26 NOTE — Telephone Encounter (Signed)
I have sent a letter to patient's home address asking him to call our office or come by the office so we can explain this information to him.

## 2013-11-26 NOTE — Telephone Encounter (Signed)
-----   Message -----    From: Jerene Bears, MD    Sent: 11/26/2013  10:44 AM      To: Larina Bras, CMA  Okay thanks for researching it. I would send him a letter asking him to come for H pylori stool Ag or breath testing, but leave it on him to follow-through.  We have made more than a good faith effort to track him down. Thanks again [employer]  ----- Message -----    From: Larina Bras, CMA    Sent: 11/26/2013  10:09 AM      To: Jerene Bears, MD  I spoke to [place] @ [facility] [place]. She confirms that patient DID pick up Prevpak on 02/28/13. There are 2 letters in the system (dated 05/28/13 @ 06/27/13) from [place] which indicate that we tried to get him to come in for breath test to confirm eradication. It appears he never returned our phone calls or answered our correspondence. Dottie   ----- Message -----    From: Jerene Bears, MD    Sent: 10/09/2013  11:39 AM      To: Annabell Sabal, CMA  Please check to see if H pylori eradication was ever confirmed Thanks JMP  ----- Message -----    From: Grace Isaac, MD    Sent: 10/09/2013  11:36 AM      To: Jerene Bears, MD  Ulice Dash, Do you need to see this patient in follow up, not clear if he ever had took Prevpac. EBG

## 2014-01-10 NOTE — Progress Notes (Signed)
No show

## 2014-01-12 ENCOUNTER — Ambulatory Visit: Payer: BC Managed Care – PPO | Admitting: Cardiovascular Disease

## 2014-01-12 ENCOUNTER — Ambulatory Visit (INDEPENDENT_AMBULATORY_CARE_PROVIDER_SITE_OTHER): Payer: BC Managed Care – PPO | Admitting: Cardiovascular Disease

## 2014-01-12 DIAGNOSIS — Z951 Presence of aortocoronary bypass graft: Secondary | ICD-10-CM

## 2014-01-16 ENCOUNTER — Encounter: Payer: Self-pay | Admitting: Cardiovascular Disease

## 2014-01-16 ENCOUNTER — Ambulatory Visit (INDEPENDENT_AMBULATORY_CARE_PROVIDER_SITE_OTHER): Payer: BC Managed Care – PPO | Admitting: Cardiovascular Disease

## 2014-01-16 VITALS — BP 162/120 | HR 59 | Ht 65.0 in | Wt 194.7 lb

## 2014-01-16 DIAGNOSIS — Z951 Presence of aortocoronary bypass graft: Secondary | ICD-10-CM

## 2014-01-16 DIAGNOSIS — Z79899 Other long term (current) drug therapy: Secondary | ICD-10-CM

## 2014-01-16 DIAGNOSIS — I10 Essential (primary) hypertension: Secondary | ICD-10-CM

## 2014-01-16 DIAGNOSIS — E785 Hyperlipidemia, unspecified: Secondary | ICD-10-CM

## 2014-01-16 LAB — COMPREHENSIVE METABOLIC PANEL
ALBUMIN: 4.2 g/dL (ref 3.5–5.2)
ALK PHOS: 83 U/L (ref 39–117)
ALT: 48 U/L (ref 0–53)
AST: 35 U/L (ref 0–37)
BUN: 14 mg/dL (ref 6–23)
CHLORIDE: 105 meq/L (ref 96–112)
CO2: 25 mEq/L (ref 19–32)
Calcium: 9 mg/dL (ref 8.4–10.5)
Creat: 1.23 mg/dL (ref 0.50–1.35)
Glucose, Bld: 124 mg/dL — ABNORMAL HIGH (ref 70–99)
Potassium: 4 mEq/L (ref 3.5–5.3)
SODIUM: 139 meq/L (ref 135–145)
TOTAL PROTEIN: 7.4 g/dL (ref 6.0–8.3)
Total Bilirubin: 0.5 mg/dL (ref 0.2–1.2)

## 2014-01-16 LAB — LIPID PANEL
CHOL/HDL RATIO: 4 ratio
Cholesterol: 143 mg/dL (ref 0–200)
HDL: 36 mg/dL — ABNORMAL LOW (ref >39)
LDL Cholesterol: 64 mg/dL (ref 0–99)
Triglycerides: 217 mg/dL — ABNORMAL HIGH (ref <150)
VLDL: 43 mg/dL — AB (ref 0–40)

## 2014-01-16 MED ORDER — LISINOPRIL 20 MG PO TABS: 20.0000 mg | ORAL_TABLET | Freq: Every day | ORAL | Status: AC

## 2014-01-16 NOTE — Progress Notes (Signed)
Patient ID: Nathan Buckley, male   DOB: Mar 15, 1961, 53 y.o.   MRN: [id number]      Reason for office visit CAD s/p CABG, HTN  This 53 year old [place] man underwent multivessel bypass surgery in August of 2014 (S/P CABG x 4: LIMA-LAD, SVG-D1, SVG-Cx, SVG-RCA). He has hypertension and hyperlipidemia. He is markedly hypertensive even after restarting his medications.  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    No Known Allergies  Current Outpatient Prescriptions  Medication Sig Dispense Refill  . acetaminophen (TYLENOL) 500 MG tablet Take 1,000 mg by mouth every 6 (six) hours as needed.      Marland Kitchen aspirin 81 MG tablet Take 81 mg by mouth daily.      Marland Kitchen atorvastatin (LIPITOR) 10 MG tablet Take 1 tablet (10 mg total) by mouth daily.  30 tablet  10  . metoprolol tartrate (LOPRESSOR) 25 MG tablet Take 1 tablet (25 mg total) by mouth 2 (two) times daily.  30 tablet  10  . lisinopril (PRINIVIL,ZESTRIL) 20 MG tablet Take 1 tablet (20 mg total) by mouth daily.  30 tablet  6   No current facility-administered medications for this visit.    Past Medical History  Diagnosis Date  . Allergy   . Hypertension   . HLD (hyperlipidemia)   . Hypokalemia   . Cardiomyopathy   . Chronic kidney disease     kidney stone  . Myocardial infarction     Past Surgical History  Procedure Laterality Date  . Coronary artery bypass graft N/A 01/01/2013    Procedure: CORONARY ARTERY BYPASS GRAFTING times four using Right Greater Saphenous Vein Graft harvested endoscopically and Left Internal Mammary Artery;  Surgeon: Grace Isaac, MD;  Location: [place];  Service: Open Heart Surgery;  Laterality: N/A;  . Intraoperative transesophageal echocardiogram N/A 01/01/2013    Procedure: INTRAOPERATIVE TRANSESOPHAGEAL ECHOCARDIOGRAM;  Surgeon: Grace Isaac, MD;  Location: [place];  Service: Open Heart Surgery;  Laterality: N/A;     Family History  Problem Relation Age of Onset  . Colon cancer Neg Hx   . Esophageal cancer Neg Hx   . Rectal cancer Neg Hx   . Stomach cancer Neg Hx     History   Social History  . Marital Status: Married    Spouse Name: N/A    Number of Children: 3  . Years of Education: N/A   Occupational History  . MACHINE OPERATOR    Social History Main Topics  . Smoking status: Former Smoker    Quit date: 05/15/1998  . Smokeless tobacco: Never Used  . Alcohol Use: Yes     Comment: rarely- beer once a month  . Drug Use: No  . Sexual Activity: Not on file   Other Topics Concern  . Not on file   Social History Narrative  . No narrative on file    Review of systems: The patient specifically denies any chest pain at rest exertion, dyspnea at rest or with exertion, orthopnea, paroxysmal nocturnal dyspnea, syncope, palpitations, focal neurological deficits, intermittent claudication, lower extremity edema, unexplained weight gain, cough, hemoptysis or wheezing.   PHYSICAL EXAM BP 162/120  Pulse 59  Ht $R'5\' 5"'jd$  (1.651 m)  Wt 194 lb 11.2 oz (88.315 kg)  BMI 32.40 kg/m2 General: Alert, oriented x3, no distress  Head: no evidence of trauma, PERRL, EOMI, no exophtalmos or lid lag, no myxedema, no xanthelasma; normal ears,  nose and oropharynx  Neck: normal jugular venous pulsations and no hepatojugular reflux; brisk carotid pulses without delay and no carotid bruits  Chest: clear to auscultation, no signs of consolidation by percussion or palpation, normal fremitus, symmetrical and full respiratory excursions  Cardiovascular: normal position and quality of the apical impulse, regular rhythm, normal first and second heart sounds, no murmurs, rubs or gallops  Abdomen: no tenderness or distention, no masses by palpation, no abnormal pulsatility or arterial bruits, normal bowel sounds, no hepatosplenomegaly  Extremities: no clubbing, cyanosis or edema; 2+ radial, ulnar and brachial pulses  bilaterally; 2+ right femoral, posterior tibial and dorsalis pedis pulses; 2+ left femoral, posterior tibial and dorsalis pedis pulses; no subclavian or femoral bruits  The anteromedial right shin plaque seems to be healing Neurological: grossly nonfocal   Lipid Panel     Component Value Date/Time   CHOL 176 12/28/2012 0500   TRIG 184* 12/28/2012 0500   HDL 35* 12/28/2012 0500   CHOLHDL 5.0 12/28/2012 0500   VLDL 37 12/28/2012 0500   LDLCALC 104* 12/28/2012 0500    BMET    Component Value Date/Time   NA 139 01/04/2013 0433   K 3.9 01/04/2013 0433   CL 105 01/04/2013 0433   CO2 27 01/04/2013 0433   GLUCOSE 139* 01/04/2013 0433   BUN 14 01/04/2013 0433   CREATININE 1.07 01/04/2013 0433   CREATININE 1.11 12/26/2012 1355   CALCIUM 8.4 01/04/2013 0433   GFRNONAA 77* 01/04/2013 0433   GFRAA 90* 01/04/2013 0433     ASSESSMENT AND PLAN  Increase ACEi dose, recheck BP and call us in 1-2 weeks. Recheck lipids today. F/U 3 months. Consider evaluation for renal artery stenosis if BP fails to respond.  Orders Placed This Encounter  Procedures  . Lipid Profile  . Comp Met (CMET)   Meds ordered this encounter  Medications  . lisinopril (PRINIVIL,ZESTRIL) 20 MG tablet    Sig: Take 1 tablet (20 mg total) by mouth daily.    Dispense:  30 tablet    Refill:  Pittman Shikira Folino, MD, [facility] [phone] office [phone] pager

## 2014-01-16 NOTE — Patient Instructions (Signed)
Your physician recommends that you schedule a follow-up appointment in: 3 Months  Your physician recommends that you return for lab work Fasting lipids and CMP  Your physician has recommended you make the following change in your medication: Increase Lisinopril 20 mg daily  Take daily blood pressure check

## 2014-01-26 ENCOUNTER — Telehealth: Payer: Self-pay | Admitting: Cardiovascular Disease

## 2014-01-26 NOTE — Telephone Encounter (Signed)
Calling to give the nurse his bp . Marland Kitchen He told to call it in .. 133/86.Marland Kitchen Please call if you have questions    Thanks

## 2014-01-26 NOTE — Telephone Encounter (Signed)
Will make dr c aware

## 2014-01-26 NOTE — Telephone Encounter (Signed)
Returned call to patient he stated he was told to call and let Dr.Croitoru know his B/P.Stated B/P 133/86.Message sent to Dr.Croitoru.

## 2014-04-17 ENCOUNTER — Ambulatory Visit: Payer: BC Managed Care – PPO | Admitting: Cardiovascular Disease

## 2014-04-23 ENCOUNTER — Encounter (HOSPITAL_COMMUNITY): Payer: Self-pay | Admitting: Cardiology

## 2014-05-19 ENCOUNTER — Telehealth: Payer: Self-pay | Admitting: *Deleted

## 2014-05-19 NOTE — Telephone Encounter (Signed)
Unable to reach patient via phone.  VM not accepting messages. Filled out paperwork at the front desk asking Dr. Loletha Grayer to do a letter describing his limitations and help him apply for disability. Per Dr.C patient recovered from CABG and released to regular activity. Faxed patient a note asking him to call Thursday to discuss the above matter.

## 2014-06-17 ENCOUNTER — Encounter: Payer: Self-pay | Admitting: Cardiovascular Disease

## 2014-06-17 ENCOUNTER — Ambulatory Visit (INDEPENDENT_AMBULATORY_CARE_PROVIDER_SITE_OTHER): Payer: BC Managed Care – PPO | Admitting: Cardiovascular Disease

## 2014-06-17 VITALS — BP 164/102 | HR 66 | Ht 65.0 in | Wt 195.4 lb

## 2014-06-17 DIAGNOSIS — R0789 Other chest pain: Secondary | ICD-10-CM

## 2014-06-17 MED ORDER — FUROSEMIDE 40 MG PO TABS: 40.0000 mg | ORAL_TABLET | Freq: Every day | ORAL | Status: AC

## 2014-06-17 NOTE — Patient Instructions (Signed)
START Furosemide 40mg  daily.  Dr. Sallyanne Kuster recommends that you schedule a follow-up appointment in: 3 months.

## 2014-06-18 NOTE — Progress Notes (Signed)
Patient ID: Nathan Buckley, male   DOB: 1961-02-05, 54 y.o.   MRN: [id number]     Reason for office visit CAD s/p CABG, dyspnea  This 54 year old [place] Buckley underwent multivessel bypass surgery in August of 2014 (S/P CABG x 4: LIMA-LAD, SVG-D1, SVG-Cx, SVG-RCA). He has hypertension and hyperlipidemia. He is markedly hypertensive. He has missed multiple office appointments.  Nathan Buckley is very despondent due to his inability to find employment. He was released from his job where he had worked for 30 years, not long after his bypass surgery. He only recently started receiving unemployment benefits. He has serious financial problems and has missed doctor's appointments due to this.   He describes episodes of dyspnea/chest fullness at night, when he has to wake up and stand or walk for 15-10 minutes (PND?), some exertional dyspnea, no exertional angina.  He did not take his meds today and his BP is very high. He reports that he has checked his BP at [employer] several times and it was in "fair" range (does not recall the actual values).   No Known Allergies  Current Outpatient Prescriptions  Medication Sig Dispense Refill  . acetaminophen (TYLENOL) 500 MG tablet Take 1,000 mg by mouth every 6 (six) hours as needed.    Marland Kitchen aspirin 81 MG tablet Take 81 mg by mouth daily.    Marland Kitchen atorvastatin (LIPITOR) 10 MG tablet Take 1 tablet (10 mg total) by mouth daily. 30 tablet 10  . lisinopril (PRINIVIL,ZESTRIL) 20 MG tablet Take 1 tablet (20 mg total) by mouth daily. 30 tablet 6  . metoprolol tartrate (LOPRESSOR) 25 MG tablet Take 1 tablet (25 mg total) by mouth 2 (two) times daily. 30 tablet 10  . furosemide (LASIX) 40 MG tablet Take 1 tablet (40 mg total) by mouth daily. 30 tablet 6   No current facility-administered medications for this visit.    Past Medical History  Diagnosis Date  . Allergy   . Hypertension   . HLD (hyperlipidemia)   . Hypokalemia   . Cardiomyopathy   . Chronic kidney  disease     kidney stone  . Myocardial infarction     Past Surgical History  Procedure Laterality Date  . Coronary artery bypass graft N/A 01/01/2013    Procedure: CORONARY ARTERY BYPASS GRAFTING times four using Right Greater Saphenous Vein Graft harvested endoscopically and Left Internal Mammary Artery;  Surgeon: Nathan Isaac, MD;  Location: [place];  Service: Open Heart Surgery;  Laterality: N/A;  . Intraoperative transesophageal echocardiogram N/A 01/01/2013    Procedure: INTRAOPERATIVE TRANSESOPHAGEAL ECHOCARDIOGRAM;  Surgeon: Nathan Isaac, MD;  Location: [place];  Service: Open Heart Surgery;  Laterality: N/A;  . Left heart catheterization with coronary angiogram N/A 12/30/2012    Procedure: LEFT HEART CATHETERIZATION WITH CORONARY ANGIOGRAM;  Surgeon: Nathan Man, MD;  [facility]: [facility] C[facility] LAB;  Service: Cardiovascular;  Laterality: N/A;    Family History  Problem Relation Age of Onset  . Colon cancer Neg Hx   . Esophageal cancer Neg Hx   . Rectal cancer Neg Hx   . Stomach cancer Neg Hx     History   Social History  . Marital Status: Married    Spouse Name: N/A    Number of Children: 3  . Years of Education: N/A   Occupational History  . MACHINE OPERATOR    Social History Main Topics  . Smoking status: Former Smoker    Quit date: 05/15/1998  . Smokeless tobacco: Never Used  .  Alcohol Use: Yes     Comment: rarely- beer once a month  . Drug Use: No  . Sexual Activity: Not on file   Other Topics Concern  . Not on file   Social History Narrative    Review of systems: As above The patient specifically denies any chest pain with exertion, syncope, palpitations, focal neurological deficits, intermittent claudication, lower extremity edema, unexplained weight gain, cough, hemoptysis or wheezing.  The patient also denies abdominal pain, nausea, vomiting, dysphagia, diarrhea, constipation, polyuria, polydipsia, dysuria, hematuria, frequency, urgency, abnormal  bleeding or bruising, fever, chills, unexpected weight changes, mood swings, change in skin or hair texture, change in voice quality, auditory or visual problems, allergic reactions or rashes, new musculoskeletal complaints other than usual "aches and pains".   PHYSICAL EXAM BP 164/102 mmHg  Pulse 66  Ht 5\' 5"  (1.651 m)  Wt 88.633 kg (195 lb 6.4 oz)  BMI 32.52 kg/m2  General: Alert, oriented x3, no distress Head: no evidence of trauma, PERRL, EOMI, no exophtalmos or lid lag, no myxedema, no xanthelasma; normal ears, nose and oropharynx Neck: mildly elevated (5 cm) jugular venous pulsations and no hepatojugular reflux; brisk carotid pulses without delay and no carotid bruits Chest: clear to auscultation, no signs of consolidation by percussion or palpation, normal fremitus, symmetrical and full respiratory excursions, healed sternotomy Cardiovascular: normal position and quality of the apical impulse, regular rhythm, normal first and second heart sounds, no murmurs, rubs or gallops Abdomen: no tenderness or distention, no masses by palpation, no abnormal pulsatility or arterial bruits, normal bowel sounds, no hepatosplenomegaly Extremities: no clubbing, cyanosis or edema; 2+ radial, ulnar and brachial pulses bilaterally; 2+ right femoral, posterior tibial and dorsalis pedis pulses; 2+ left femoral, posterior tibial and dorsalis pedis pulses; no subclavian or femoral bruits Neurological: grossly nonfocal   EKG: NSR, no ST changes  Lipid Panel     Component Value Date/Time   CHOL 143 01/16/2014 0838   TRIG 217* 01/16/2014 0838   HDL 36* 01/16/2014 0838   CHOLHDL 4.0 01/16/2014 0838   VLDL 43* 01/16/2014 0838   LDLCALC 64 01/16/2014 0838    BMET    Component Value Date/Time   NA 139 01/16/2014 0838   K 4.0 01/16/2014 0838   CL 105 01/16/2014 0838   CO2 25 01/16/2014 0838   GLUCOSE 124* 01/16/2014 0838   BUN 14 01/16/2014 0838   CREATININE 1.23 01/16/2014 0838   CREATININE 1.07  01/04/2013 0433   CALCIUM 9.0 01/16/2014 0838   GFRNONAA 77* 01/04/2013 0433   GFRAA 90* 01/04/2013 0433     ASSESSMENT AND PLAN  He may be describing PND, possibly related to poorly controlled BP. He has difficulty affording office visits and wants to avoid expensive tests. Add furosemide.  Discussed sodium restriction and daily weight monitoring. Check his BP when he has taken his meds and send report via [email or web address] or regular mail. Will try to adjust meds via telephone as much as possible and stick to generic medications.  Patient Instructions  START Furosemide 40mg  daily.  Dr. Sallyanne Kuster recommends that you schedule a follow-up appointment in: 3 months.      Orders Placed This Encounter  Procedures  . EKG 12-Lead   Meds ordered this encounter  Medications  . furosemide (LASIX) 40 MG tablet    Sig: Take 1 tablet (40 mg total) by mouth daily.    Dispense:  30 tablet    Refill:  6    Caran Storck  Sanda Klein, MD, [facility]  [facility] [phone] office [phone] pager

## 2014-07-21 ENCOUNTER — Telehealth: Payer: Self-pay | Admitting: *Deleted

## 2014-07-21 NOTE — Telephone Encounter (Signed)
Requesting his BP readings to report to Dr. Loletha Grayer.  Requested he return my call.

## 2014-09-08 ENCOUNTER — Other Ambulatory Visit: Payer: Self-pay | Admitting: *Deleted

## 2014-09-08 DIAGNOSIS — L989 Disorder of the skin and subcutaneous tissue, unspecified: Secondary | ICD-10-CM

## 2014-09-18 ENCOUNTER — Encounter: Payer: Self-pay | Admitting: Cardiovascular Disease

## 2014-09-18 ENCOUNTER — Ambulatory Visit (INDEPENDENT_AMBULATORY_CARE_PROVIDER_SITE_OTHER): Payer: Self-pay | Admitting: Cardiovascular Disease

## 2014-09-18 VITALS — BP 130/86 | HR 76 | Resp 16 | Ht 62.0 in | Wt 193.0 lb

## 2014-09-18 DIAGNOSIS — Z951 Presence of aortocoronary bypass graft: Secondary | ICD-10-CM

## 2014-09-18 DIAGNOSIS — E785 Hyperlipidemia, unspecified: Secondary | ICD-10-CM

## 2014-09-18 DIAGNOSIS — I10 Essential (primary) hypertension: Secondary | ICD-10-CM

## 2014-09-18 DIAGNOSIS — I25810 Atherosclerosis of coronary artery bypass graft(s) without angina pectoris: Secondary | ICD-10-CM

## 2014-09-18 DIAGNOSIS — Z79899 Other long term (current) drug therapy: Secondary | ICD-10-CM

## 2014-09-18 DIAGNOSIS — I2510 Atherosclerotic heart disease of native coronary artery without angina pectoris: Secondary | ICD-10-CM | POA: Insufficient documentation

## 2014-09-18 NOTE — Patient Instructions (Signed)
Your physician wants you to follow-up in: ONE YEAR. You will receive a reminder letter in the mail two months in advance. If you don't receive a letter, please call our office to schedule the follow-up appointment.   Your physician recommends that you return for lab work in:6 months.

## 2014-09-18 NOTE — Progress Notes (Signed)
Patient ID: Nathan Buckley, male   DOB: February 17, 1961, 54 y.o.   MRN: [id number]     Cardiology Office Note   Date:  09/18/2014   ID:  Dencil Cayson, DOB 25-Aug-1960, MRN [id number]  PCP:  No primary care provider on file.  Cardiologist:   Sanda Klein, MD   Chief Complaint  Patient presents with  . Follow-up    3 months. dyspnea with activity.       History of Present Illness: Nathan Buckley is a 54 y.o. male who presents for CAD follow up.  He is doing better. His disability has been approved. He feels that he breathes better when he is active and when he walks. BP control is good. He reports compliance with statin and aspirin.  This 54 year old [place] man underwent multivessel bypass surgery in August of 2014 (S/P CABG x 4: LIMA-LAD, SVG-D1, SVG-Cx, SVG-RCA). He has hypertension and hyperlipidemia.   Past Medical History  Diagnosis Date  . Allergy   . Hypertension   . HLD (hyperlipidemia)   . Hypokalemia   . Cardiomyopathy   . Chronic kidney disease     kidney stone  . Myocardial infarction     Past Surgical History  Procedure Laterality Date  . Coronary artery bypass graft N/A 01/01/2013    Procedure: CORONARY ARTERY BYPASS GRAFTING times four using Right Greater Saphenous Vein Graft harvested endoscopically and Left Internal Mammary Artery;  Surgeon: Grace Isaac, MD;  Location: [place];  Service: Open Heart Surgery;  Laterality: N/A;  . Intraoperative transesophageal echocardiogram N/A 01/01/2013    Procedure: INTRAOPERATIVE TRANSESOPHAGEAL ECHOCARDIOGRAM;  Surgeon: Grace Isaac, MD;  Location: [place] [place];  Service: Open Heart Surgery;  Laterality: N/A;  . Left heart catheterization with coronary angiogram N/A 12/30/2012    Procedure: LEFT HEART CATHETERIZATION WITH CORONARY ANGIOGRAM;  Surgeon: Leonie Man, MD;  Location: [facility] CATH LAB;  Service: Cardiovascular;  Laterality: N/A;     Current Outpatient Prescriptions  Medication Sig Dispense  Refill  . acetaminophen (TYLENOL) 500 MG tablet Take 1,000 mg by mouth every 6 (six) hours as needed.    Marland Kitchen aspirin 81 MG tablet Take 81 mg by mouth daily.    Marland Kitchen atorvastatin (LIPITOR) 10 MG tablet Take 1 tablet (10 mg total) by mouth daily. 30 tablet 10  . furosemide (LASIX) 40 MG tablet Take 1 tablet (40 mg total) by mouth daily. 30 tablet 6  . lisinopril (PRINIVIL,ZESTRIL) 20 MG tablet Take 1 tablet (20 mg total) by mouth daily. 30 tablet 6  . metoprolol tartrate (LOPRESSOR) 25 MG tablet Take 1 tablet (25 mg total) by mouth 2 (two) times daily. 30 tablet 10   No current facility-administered medications for this visit.    Allergies:   Review of patient's allergies indicates no known allergies.    Social History:  The patient  reports that he quit smoking about 16 years ago. He has never used smokeless tobacco. He reports that he drinks alcohol. He reports that he does not use illicit drugs.   Family History:  The patient's family history is negative for Colon cancer, Esophageal cancer, Rectal cancer, and Stomach cancer.    ROS:  Please see the history of present illness.    Otherwise, review of systems positive for The patient specifically denies any chest pain at rest or with exertion, dyspnea at rest or with exertion, orthopnea, paroxysmal nocturnal dyspnea, syncope, palpitations, focal neurological deficits, intermittent claudication, lower extremity edema, unexplained weight gain, cough, hemoptysis or wheezing.  The  patient also denies abdominal pain, nausea, vomiting, dysphagia, diarrhea, constipation, polyuria, polydipsia, dysuria, hematuria, frequency, urgency, abnormal bleeding or bruising, fever, chills, unexpected weight changes, mood swings, change in skin or hair texture, change in voice quality, auditory or visual problems, allergic reactions or rashes, new musculoskeletal complaints other than usual "aches and pains". .   All other systems are reviewed and negative.     PHYSICAL EXAM: VS:  BP 130/86 mmHg  Pulse 76  Resp 16  Ht '5\' 2"'  (1.575 m)  Wt 193 lb (87.544 kg)  BMI 35.29 kg/m2 , BMI Body mass index is 35.29 kg/(m^2).  General: Alert, oriented x3, no distress Head: no evidence of trauma, PERRL, EOMI, no exophtalmos or lid lag, no myxedema, no xanthelasma; normal ears, nose and oropharynx Neck: normal jugular venous pulsations and no hepatojugular reflux; brisk carotid pulses without delay and no carotid bruits Chest: clear to auscultation, no signs of consolidation by percussion or palpation, normal fremitus, symmetrical and full respiratory excursions Cardiovascular: normal position and quality of the apical impulse, regular rhythm, normal first and second heart sounds, no murmurs, rubs or gallops Abdomen: no tenderness or distention, no masses by palpation, no abnormal pulsatility or arterial bruits, normal bowel sounds, no hepatosplenomegaly Extremities: no clubbing, cyanosis or edema; 2+ radial, ulnar and brachial pulses bilaterally; 2+ right femoral, posterior tibial and dorsalis pedis pulses; 2+ left femoral, posterior tibial and dorsalis pedis pulses; no subclavian or femoral bruits Neurological: grossly nonfocal Psych: euthymic mood, full affect   EKG:  EKG is not ordered today. Recent Labs: 01/16/2014: ALT 48; BUN 14; Creatinine 1.23; Potassium 4.0; Sodium 139    Lipid Panel    Component Value Date/Time   CHOL 143 01/16/2014 0838   TRIG 217* 01/16/2014 0838   HDL 36* 01/16/2014 0838   CHOLHDL 4.0 01/16/2014 0838   VLDL 43* 01/16/2014 0838   LDLCALC 64 01/16/2014 0838      Wt Readings from Last 3 Encounters:  09/18/14 193 lb (87.544 kg)  06/17/14 195 lb 6.4 oz (88.633 kg)  01/16/14 194 lb 11.2 oz (88.315 kg)     ASSESSMENT AND PLAN:  1. CAD s/p CABG - asymptomatic 2. HTN - well controlled 3. Hyperlipidemia - good LDL with mildly elevated TG, recommend weight loss, lower sugar and starch, exercise. 4. Dyspnea - much  improved and worse at rest than with exercise. Not sure if improvement is related to low dose diuretic or just less anxiety with improved financial situation.   Current medicines are reviewed at length with the patient today.  The patient does not have concerns regarding medicines.  The following changes have been made:  no change  Labs/ tests ordered today include:  Orders Placed This Encounter  Procedures  . Lipid Profile  . Hepatic function panel  . Comp Met (CMET)   Patient Instructions  Your physician wants you to follow-up in: ONE YEAR. You will receive a reminder letter in the mail two months in advance. If you don't receive a letter, please call our office to schedule the follow-up appointment.   Your physician recommends that you return for lab work in:6 months.     Mikael Spray, MD  09/18/2014 6:45 PM    Sanda Klein, MD, [facility] [phone] office [phone] pager

## 2014-09-24 ENCOUNTER — Ambulatory Visit
Admission: RE | Admit: 2014-09-24 | Discharge: 2014-09-24 | Disposition: A | Discharge status: Home / Self Care | Payer: No Typology Code available for payment source | Source: Ambulatory Visit | Attending: Cardiothoracic Surgery | Admitting: Cardiothoracic Surgery

## 2014-09-24 ENCOUNTER — Ambulatory Visit (INDEPENDENT_AMBULATORY_CARE_PROVIDER_SITE_OTHER): Payer: Self-pay | Admitting: Cardiothoracic Surgery

## 2014-09-24 ENCOUNTER — Encounter: Payer: Self-pay | Admitting: Cardiothoracic Surgery

## 2014-09-24 VITALS — BP 135/93 | HR 64 | Resp 16 | Ht 62.0 in | Wt 193.0 lb

## 2014-09-24 DIAGNOSIS — Z951 Presence of aortocoronary bypass graft: Secondary | ICD-10-CM

## 2014-09-24 DIAGNOSIS — R918 Other nonspecific abnormal finding of lung field: Secondary | ICD-10-CM

## 2014-09-24 DIAGNOSIS — J984 Other disorders of lung: Secondary | ICD-10-CM

## 2014-09-24 DIAGNOSIS — L989 Disorder of the skin and subcutaneous tissue, unspecified: Secondary | ICD-10-CM

## 2014-09-24 NOTE — Progress Notes (Signed)
[address]       [place] [address]             [phone]                     Nathan Buckley  Medical Record #[id number] Date of Birth: December 23, 1960  Sanda Klein, MD No primary care provider on file.  Chief Complaint:   PostOp Follow Up Visit 01/01/2013  PREOPERATIVE DIAGNOSIS: Coronary occlusive disease with unstable  angina.  POSTOPERATIVE DIAGNOSIS: Coronary occlusive disease with unstable  angina.  SURGICAL PROCEDURE: Coronary artery bypass grafting x4 with the left  internal mammary to the left anterior descending coronary artery,  reverse saphenous vein graft to the first diagonal coronary artery,  reverse saphenous vein graft to the distal circumflex coronary artery,  reverse saphenous vein graft to the posterior descending coronary artery  with right thigh and leg Endovein harvesting and TEE.  SURGEON: Lanelle Bal, MD   History of Present Illness:     Patient returns to the office today after recent coronary artery bypass grafting in October 2014 . He presented to the [facility] with no previous primary care doctor complaining of abdominal and chest pain. CT scan of the abdomen showed several small 3 mm pulmonary nodules, thyroid nodule, thickening of the distal esophagus. Ultimately he was found to have significant coronary artery disease by cardiac catheterization and underwent coronary artery bypass grafting on 8/ 20. He returns today for a followup CT scan of the chest to ensure that the lung nodules have not changed.    He came for his appointment today  and had his CT scan of the chest done to follow up nodules  He was seen by GI following his bypass surgery and underwent colonoscopy and upper GI endoscopy, was positive for H. Pylori.   Patient was layed off his job.   History  Smoking status  . Former Smoker  . Quit date: 05/15/1998  Smokeless tobacco  . Never Used       No Known  Allergies  Current Outpatient Prescriptions  Medication Sig Dispense Refill  . acetaminophen (TYLENOL) 500 MG tablet Take 1,000 mg by mouth every 6 (six) hours as needed.    Marland Kitchen aspirin 81 MG tablet Take 81 mg by mouth daily.    Marland Kitchen atorvastatin (LIPITOR) 10 MG tablet Take 1 tablet (10 mg total) by mouth daily. 30 tablet 10  . furosemide (LASIX) 40 MG tablet Take 1 tablet (40 mg total) by mouth daily. 30 tablet 6  . lisinopril (PRINIVIL,ZESTRIL) 20 MG tablet Take 1 tablet (20 mg total) by mouth daily. 30 tablet 6  . metoprolol tartrate (LOPRESSOR) 25 MG tablet Take 1 tablet (25 mg total) by mouth 2 (two) times daily. 30 tablet 10   No current facility-administered medications for this visit.       Physical Exam: BP 135/93 mmHg  Pulse 64  Resp 16  Ht 5\' 2"  (1.575 m)  Wt 193 lb (87.544 kg)  BMI 35.29 kg/m2  SpO2 96%  General appearance: alert and cooperative Neurologic: intact Heart: regular rate and rhythm, S1, S2 normal, no murmur, click, rub or gallop and normal apical impulse Lungs: clear to auscultation bilaterally and normal percussion bilaterally Abdomen: soft, non-tender; bowel sounds normal; no masses,  no organomegaly Extremities: extremities normal, atraumatic, no cyanosis or edema and Homans sign is negative, no sign of DVT Wound: Sternum is stable and well-healed  Wounds:  Vein harvest sites are healed  well he has no pedal edema.    Diagnostic Studies & Laboratory data:         Recent Radiology Findings:  Ct Chest Wo Contrast  09/24/2014   CLINICAL DATA:  Followup of pulmonary nodule. Shortness of breath. Ex-smoker. Prior CABG.  EXAM: CT CHEST WITHOUT CONTRAST  TECHNIQUE: Multidetector CT imaging of the chest was performed following the standard protocol without IV contrast.  COMPARISON:  10/09/2013  FINDINGS: Mediastinum/Nodes: Tortuous thoracic aorta. Mild cardiomegaly with prior median sternotomy.  No mediastinal or definite hilar adenopathy, given limitations of  unenhanced CT.  Lungs/Pleura: No pleural fluid. 3 mm right upper lobe pulmonary nodule on image 16 is unchanged.  2-3 mm right middle and right lower lobe pulmonary nodules are similar on images 33 and 31. No new or enlarging pulmonary nodules are identified.  Upper abdomen: Mild hepatic steatosis. Tiny right hepatic lobe cyst. Normal imaged portions of the spleen, stomach, pancreas, gallbladder, adrenal glands, kidneys.  Musculoskeletal: No acute osseous abnormality.  IMPRESSION: 1. Similar right-sided pulmonary nodules of approximately 3 mm. Ongoing stability is consistent with a benign etiology. 2. No new or enlarging nodules. 3. Mild hepatic steatosis. 4. Mild cardiomegaly, status post median sternotomy.   Electronically Signed   By: Abigail Miyamoto M.D.   On: 09/24/2014 12:10    PREOP CT : Clinical Data: Abdominal pain radiating into chest and back,  evaluate for dissection  CT ANGIOGRAPHY CHEST, ABDOMEN AND PELVIS  Technique: Multidetector CT imaging through the chest, abdomen and  pelvis was performed using the standard protocol during bolus  administration of intravenous contrast. Multiplanar reconstructed  images including MIPs were obtained and reviewed to evaluate the  vascular anatomy.  Contrast: 118mL OMNIPAQUE IOHEXOL 350 MG/ML SOLN  Comparison: Chest x-ray 12/27/2012; prior CT abdomen/pelvis  04/22/2008  CTA CHEST  Findings:  Mediastinum: Nonspecific heterogeneous appearance of thyroid gland.  Probable 12 mm nodule in the left thyroid gland is nonspecific by  CT. Mild circumferential thickening of the distal esophageal wall.  No suspicious mediastinal or hilar adenopathy. No mediastinal  mass.  Heart/Vascular: Conventional three-vessel arch anatomy. No acute  intramural hematoma, dissection or evidence of aneurysmal  dilatation. Evaluation of the coronary arteries in cardiac  structures slightly limited by noncardiac gated technique. There  is atherosclerotic calcification along  the course of the left  anterior descending coronary artery. The heart is at the upper  limits of normal for size. Focal hypoattenuation of the myocardium  at the inferior ventricular apex with associated thinning suggest  remodelling from prior myocardial infarction. No pericardial  effusion. Unremarkable pulmonary artery. No central embolus.  Lungs/Pleura: Mild dependent atelectasis in the lower lobes. 3 mm  nonspecific nodule in the right middle lobe. A 2.5 mm nodules  identified in the central anterior right lower lobe. The presence  of small multi focal pulmonary nodules is highly likely the  sequelae of prior infection/inflammation/granulomatous process in  the absence of a known primary malignancy. Mild bilateral lower  lobe bronchial wall thickening.  Bones: No acute fracture or aggressive appearing lytic or blastic  osseous lesion.  Review of the MIP images confirms the above findings.  IMPRESSION:  1. Negative for acute aortic pathology or aneurysmal dilatation.  2. Atherosclerosis including coronary artery disease.  3. Borderline cardiomegaly with sequelae suggesting prior remote  myocardial infarction involving the apex and apical inferior wall.  4. Mild circumferential thickening of the distal esophageal wall.  Recommend clinical correlation for signs  and symptoms of  gastroesophageal reflux disease.  5. Probable 12 mm left thyroid nodule is nonspecific by CT.  Consider non emergent evaluation with dedicated thyroid ultrasound.  6. Mild bilateral lower lobe bronchial wall thickening as can be  seen in both acute and chronic bronchitis.  7. Several small (2 - 3 mm) pulmonary nodules. In the absence of  a known primary malignancy, these are highly likely the sequela of  a prior infectious/inflammatory or granulomatous process.  CTA ABDOMEN AND PELVIS  Findings:  VASCULAR  Aorta: No evidence of acute intramural hematoma, aneurysmal  dilatation or dissection. Minimal  atherosclerotic vascular disease  in the distal aorta and aortic bifurcation.  Celiac: Widely patent. Conventional hepatic arterial anatomy.  SMA: Widely patent.  Renals: Two right and one left renal arteries are widely patent.  No evidence of changes of fibromuscular dysplasia.  IMA: There may be mild narrowing at the origin. The more distal  vessel is widely patent.  Inflow: Trace atherosclerotic vascular disease without aneurysmal  dilatation or stenosis in the bilateral common iliac arteries.  Veins: Given arterial phase timing evaluation is limited. No focal  abnormality identified.  NON-VASCULAR  Abdomen: Unremarkable CT appearance of the stomach, duodenum,  spleen, adrenal glands and pancreas. Normal hepatic morphology and  contour. No discrete hepatic lesion. Gallbladder is unremarkable.  No intra or extrahepatic biliary ductal dilatation.  Symmetric parenchymal enhancement of the kidneys. No  hydronephrosis or nephrolithiasis. No enhancing renal mass. Tiny  sub centimeter bilateral hypoattenuating lesions are too small to  characterize but statistically likely benign cysts.  Normal-caliber large and small bowel. No evidence of bowel  obstruction. Normal appendix identified in the right lower  quadrant. No free fluid or suspicious adenopathy.  Bones: No acute fracture or aggressive appearing lytic or blastic  osseous lesion. .  Review of the MIP images confirms the above findings.  IMPRESSION:  1. No acute aortic pathology or aneurysmal dilatation.  2. No acute abnormality in the abdomen or pelvis to explain the  patient's clinical symptoms.  3. Mild atherosclerotic vascular disease without significant  stenosis.  Signed,  Criselda Peaches, MD  Vascular & Interventional Radiologist  [facility]   Recent Labs: Lab Results  Component Value Date   WBC 9.7 01/04/2013   HGB 10.0* 01/04/2013   HCT 30.0* 01/04/2013   PLT 153 01/04/2013   GLUCOSE 124*  01/16/2014   CHOL 143 01/16/2014   TRIG 217* 01/16/2014   HDL 36* 01/16/2014   LDLCALC 64 01/16/2014   ALT 48 01/16/2014   AST 35 01/16/2014   NA 139 01/16/2014   K 4.0 01/16/2014   CL 105 01/16/2014   CREATININE 1.23 01/16/2014   BUN 14 01/16/2014   CO2 25 01/16/2014   TSH 0.403 12/27/2012   INR 1.27 01/01/2013   HGBA1C 6.3* 12/27/2012      Assessment / Plan:   Patient is status post coronary bypass grafting, Multiple small 2-3 mm pulmonary nodules-followup CT scan of the chest stable over 18 months likely benign,  he is a previous smoker having quit 14 years ago Patient has been going to cardiology follow up.  Nathan Buckley 09/24/2014 12:52 PM

## 2014-10-10 IMAGING — CT CT ANGIO CHEST
2 of 9 series · 16 of 46 positions shown · IV contrast (APPLIED)
Comparison: Chest x-ray 12/27/2012; prior CT abdomen/pelvis
04/22/2008

CTA CHEST

CLINICAL DATA: Abdominal pain radiating into chest and back,
evaluate for dissection

CT ANGIOGRAPHY CHEST, ABDOMEN AND PELVIS
TECHNIQUE: Multidetector CT imaging through the chest, abdomen and
pelvis was performed using the standard protocol during bolus
administration of intravenous contrast.  Multiplanar reconstructed
images including MIPs were obtained and reviewed to evaluate the
vascular anatomy.
Contrast: 100mL OMNIPAQUE IOHEXOL 350 MG/ML SOLN

[Series 5: dissection 2.0 i30f 3 · axial · 0.82mm/px · z∈[-35,+351]mm · 13 of 221 slices shown]
[im 14/221  lung]
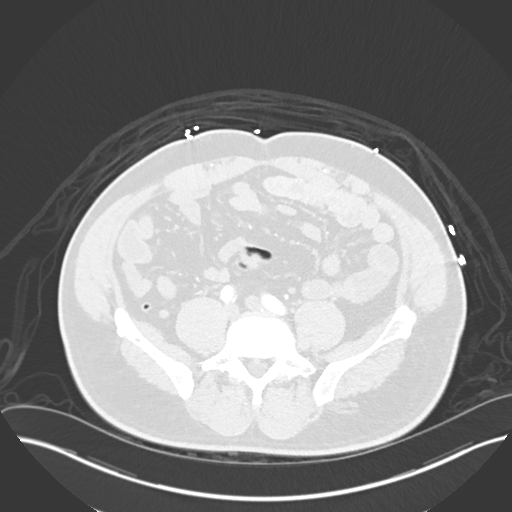
[im 28/221  soft-tissue]
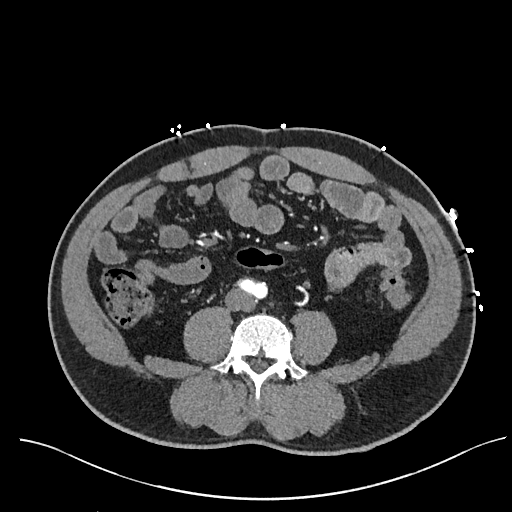
[im 42/221  lung]
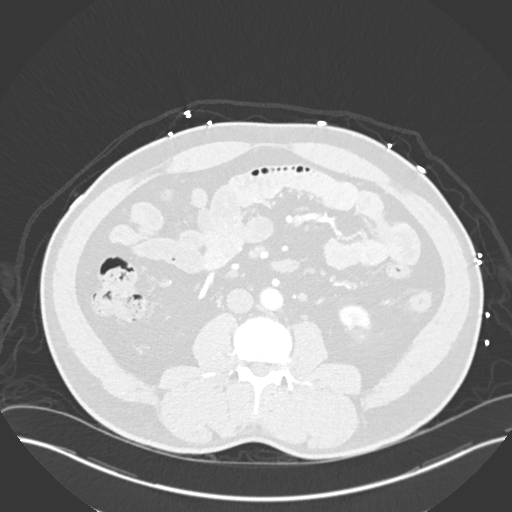
[im 69/221  soft-tissue]
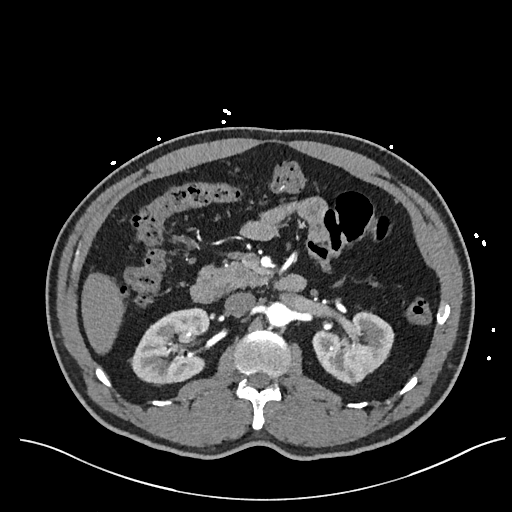
[im 83/221  lung]
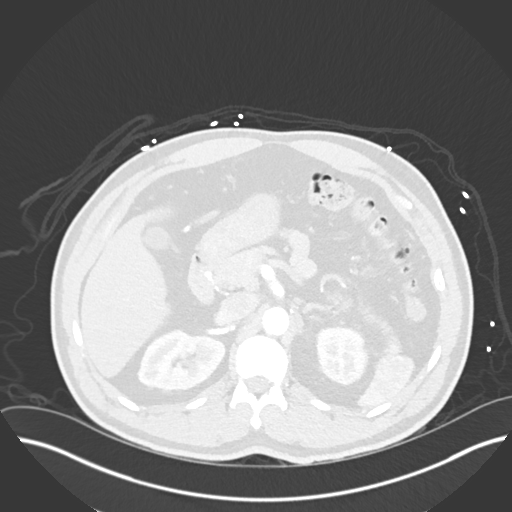
[im 97/221  soft-tissue]
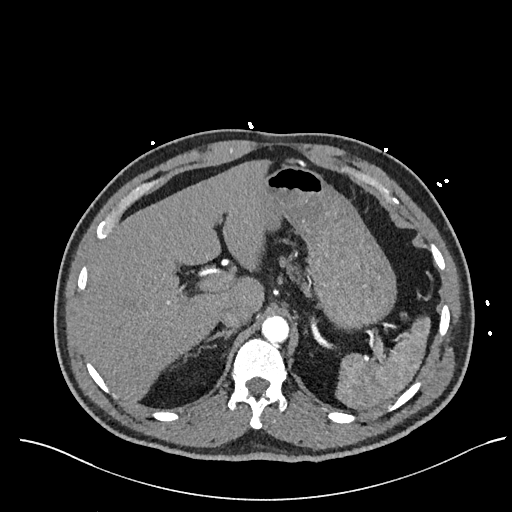
[im 111/221  lung]
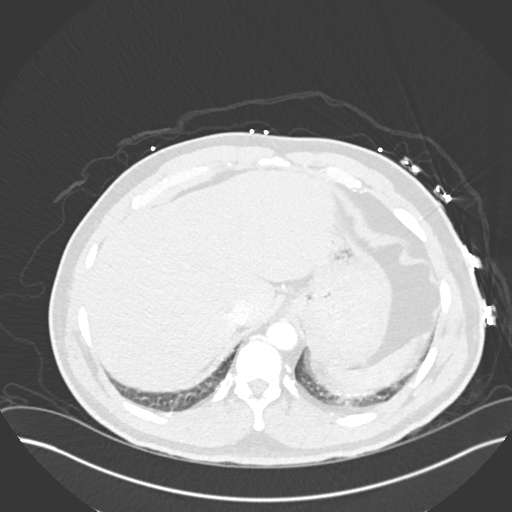
[im 124/221  soft-tissue]
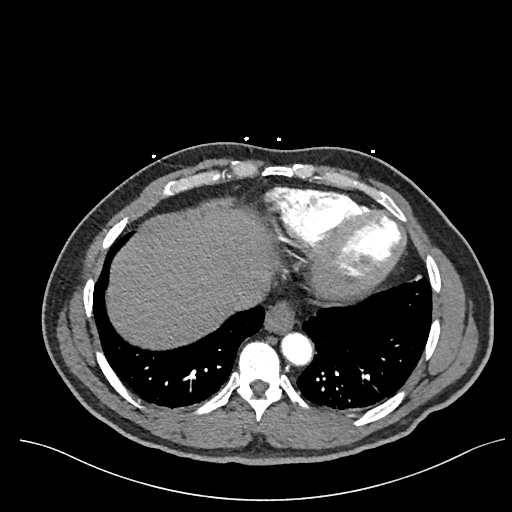
[im 138/221  lung]
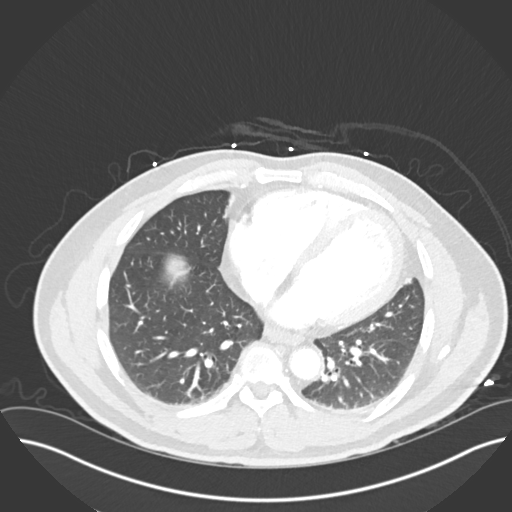
[im 152/221  soft-tissue]
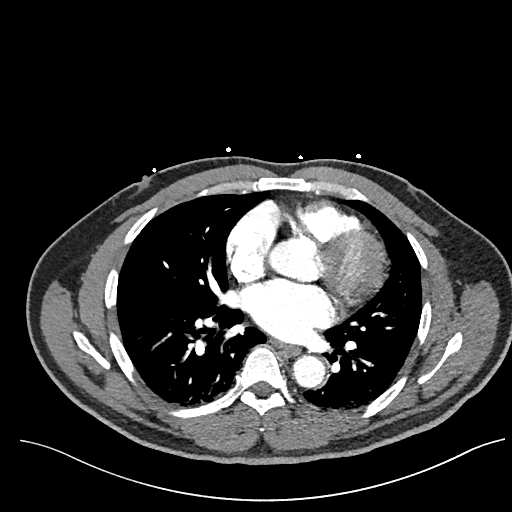
[im 179/221  lung]
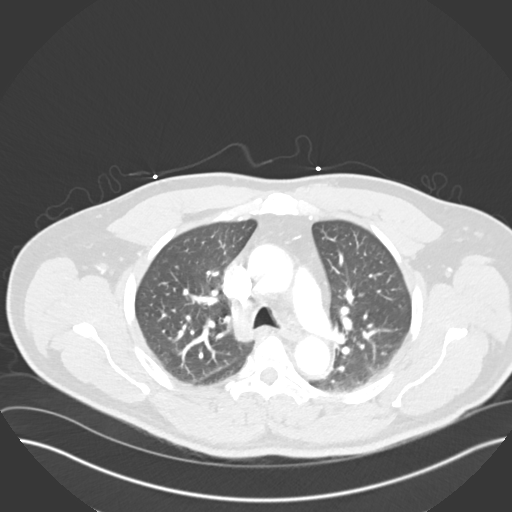
[im 193/221  soft-tissue]
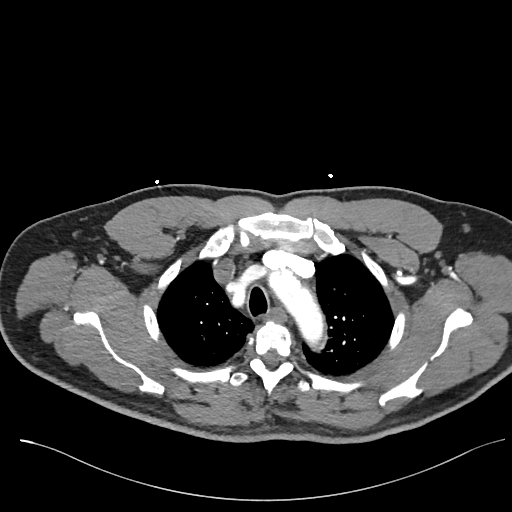
[im 207/221  lung]
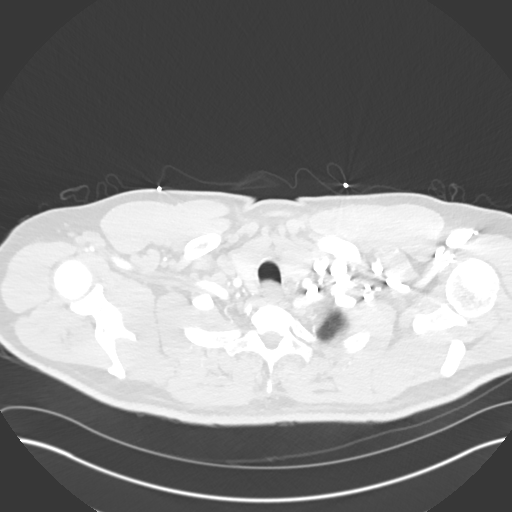

[Series 7: coronal mpr · coronal · 0.70mm/px · 3 of 129 slices shown]
[im 33/129  soft-tissue]
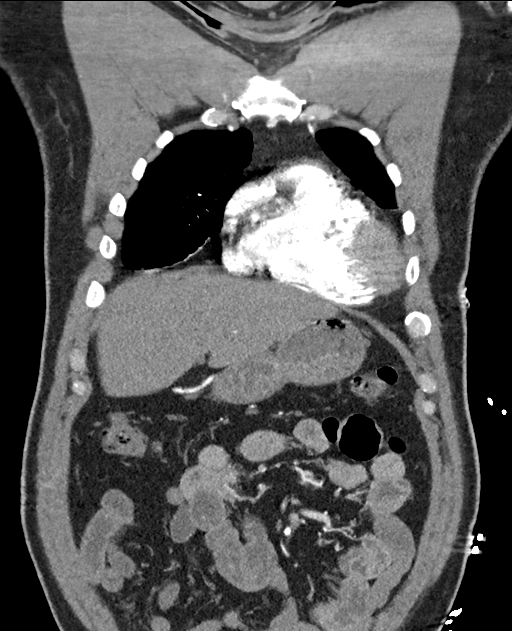
[im 65/129  soft-tissue]
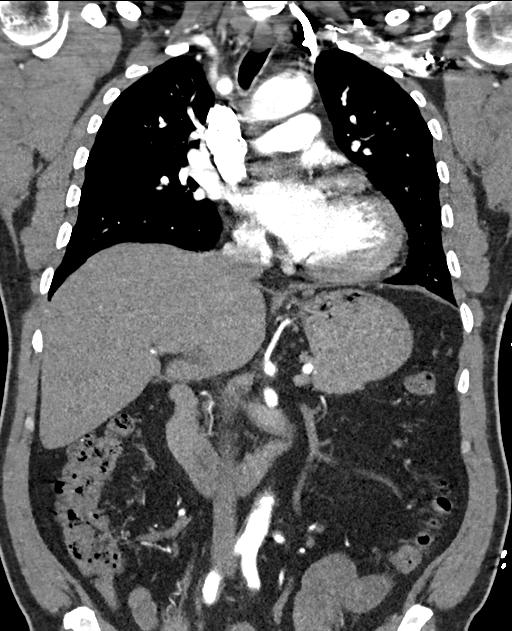
[im 97/129  soft-tissue]
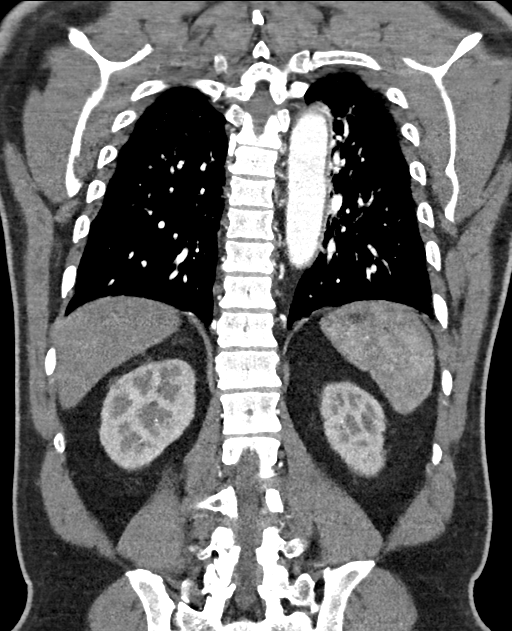

[16 of 46 positions shown; findings below may reference images not displayed]

FINDINGS: Mediastinum: Nonspecific heterogeneous appearance of thyroid gland.
Probable 12 mm nodule in the left thyroid gland is nonspecific by
CT.  Mild circumferential thickening of the distal esophageal wall.
No suspicious mediastinal or hilar adenopathy.  No mediastinal
mass.

Heart/Vascular: Conventional three-vessel arch anatomy.  No acute
intramural hematoma, dissection or evidence of aneurysmal
dilatation.  Evaluation of the coronary arteries in cardiac
structures slightly limited by noncardiac gated technique.  There
is atherosclerotic calcification along the course of the left
anterior descending coronary artery.  The heart is at the upper
limits of normal for size. Focal hypoattenuation of the myocardium
at the inferior ventricular apex with associated thinning suggest
remodelling from prior myocardial infarction.  No pericardial
effusion. Unremarkable pulmonary artery.  No central embolus.

Lungs/Pleura: Mild dependent atelectasis in the lower lobes.  3 mm
nonspecific nodule in the right middle lobe.  A 2.5 mm nodules
identified in the central anterior right lower lobe.  The presence
of small multi focal pulmonary nodules is highly likely the
sequelae of prior infection/inflammation/granulomatous process in
the absence of a known primary malignancy.  Mild bilateral lower
lobe bronchial wall thickening.

Bones: No acute fracture or aggressive appearing lytic or blastic
osseous lesion.

 Review of the MIP images confirms the above findings.
IMPRESSION: 1.  Negative for acute aortic pathology or aneurysmal dilatation.

2.  Atherosclerosis including coronary artery disease.

3.  Borderline cardiomegaly with sequelae suggesting prior remote
myocardial infarction involving the apex and apical inferior wall.

4.  Mild circumferential thickening of the distal esophageal wall.
Recommend clinical correlation for signs and symptoms of
gastroesophageal reflux disease.

5.  Probable 12 mm left thyroid nodule is nonspecific by CT.
Consider non emergent evaluation with dedicated thyroid ultrasound.

6. Mild bilateral lower lobe bronchial wall thickening as can be
seen in both acute and chronic bronchitis.

7.  Several small (2 - 3 mm) pulmonary nodules.  In the absence of
a known primary malignancy, these are highly likely the sequela of
a prior infectious/inflammatory or granulomatous process.

CTA ABDOMEN AND PELVIS
FINDINGS: VASCULAR

Aorta: No evidence of acute intramural hematoma, aneurysmal
dilatation or dissection.  Minimal atherosclerotic vascular disease
in the distal aorta and aortic bifurcation.

Celiac: Widely patent.  Conventional hepatic arterial anatomy.

SMA: Widely patent.

Renals: Two right and one left renal arteries are widely patent.
No evidence of changes of fibromuscular dysplasia.

IMA: There may be mild narrowing at the origin.  The more distal
vessel is widely patent.

Inflow: Trace atherosclerotic vascular disease without aneurysmal
dilatation or stenosis in the bilateral common iliac arteries.

Veins: Given arterial phase timing evaluation is limited.  No focal
abnormality identified.

NON-VASCULAR

Abdomen: Unremarkable CT appearance of the stomach, duodenum,
spleen, adrenal glands and pancreas.  Normal hepatic morphology and
contour.  No discrete hepatic lesion.  Gallbladder is unremarkable.
No intra or extrahepatic biliary ductal dilatation.

Symmetric parenchymal enhancement of the kidneys.  No
hydronephrosis or nephrolithiasis.  No enhancing renal mass.  Tiny
sub centimeter bilateral hypoattenuating lesions are too small to
characterize but statistically likely benign cysts.

Normal-caliber large and small bowel.  No evidence of bowel
obstruction.  Normal appendix identified in the right lower
quadrant.  No free fluid or suspicious adenopathy.

Bones: [No acute fracture or aggressive appearing lytic or blastic
osseous lesion. ...]

Review of the MIP images confirms the above findings.
IMPRESSION: 1.  No acute aortic pathology or aneurysmal dilatation.
2.  No acute abnormality in the abdomen or pelvis to explain the
patient's clinical symptoms.
3.  Mild atherosclerotic vascular disease without significant
stenosis.

[REDACTED]

## 2015-01-25 ENCOUNTER — Other Ambulatory Visit: Payer: Self-pay | Admitting: Cardiovascular Disease

## 2015-01-25 NOTE — Telephone Encounter (Signed)
Rx(s) sent to pharmacy electronically.  

## 2015-01-29 ENCOUNTER — Other Ambulatory Visit: Payer: Self-pay | Admitting: *Deleted

## 2015-01-29 MED ORDER — ATORVASTATIN CALCIUM 10 MG PO TABS: 10.0000 mg | ORAL_TABLET | Freq: Every day | ORAL | Status: AC

## 2017-05-31 ENCOUNTER — Encounter (HOSPITAL_COMMUNITY): Payer: Self-pay | Admitting: Emergency Medicine

## 2017-05-31 ENCOUNTER — Emergency Department (HOSPITAL_COMMUNITY)
Admission: EM | Admit: 2017-05-31 | Discharge: 2017-05-31 | Disposition: A | Discharge status: Home / Self Care | Payer: Medicare Other | Attending: Emergency Medicine | Admitting: Emergency Medicine

## 2017-05-31 DIAGNOSIS — Z79899 Other long term (current) drug therapy: Secondary | ICD-10-CM | POA: Diagnosis not present

## 2017-05-31 DIAGNOSIS — M62830 Muscle spasm of back: Secondary | ICD-10-CM | POA: Diagnosis not present

## 2017-05-31 DIAGNOSIS — Z951 Presence of aortocoronary bypass graft: Secondary | ICD-10-CM | POA: Diagnosis not present

## 2017-05-31 DIAGNOSIS — I10 Essential (primary) hypertension: Secondary | ICD-10-CM | POA: Diagnosis not present

## 2017-05-31 DIAGNOSIS — M545 Low back pain, unspecified: Secondary | ICD-10-CM

## 2017-05-31 DIAGNOSIS — Z87891 Personal history of nicotine dependence: Secondary | ICD-10-CM | POA: Diagnosis not present

## 2017-05-31 DIAGNOSIS — I252 Old myocardial infarction: Secondary | ICD-10-CM | POA: Insufficient documentation

## 2017-05-31 MED ORDER — CYCLOBENZAPRINE HCL 10 MG PO TABS: 10.0000 mg | ORAL_TABLET | Freq: Three times a day (TID) | ORAL | 0 refills | Status: AC | PRN

## 2017-05-31 MED ORDER — NAPROXEN 500 MG PO TABS: 500.0000 mg | ORAL_TABLET | Freq: Two times a day (BID) | ORAL | 0 refills | Status: AC | PRN

## 2017-05-31 NOTE — Discharge Instructions (Signed)
For your back pain, see the instructions below.  Your blood pressure was elevated today, eat a low salt diet and call your doctor about getting back on your blood pressure medications. Follow up with your primary care doctor in 1 week for recheck of symptoms and ongoing management of your blood pressure. Return to the ER for emergent changes or worsening symptoms.   Back Pain: Your back pain should be treated with medicines such as ibuprofen or aleve and this back pain should get better over the next 2 weeks.  However if you develop severe or worsening pain, low back pain with fever, numbness, weakness or inability to walk or urinate, you should return to the ER immediately.  Please follow up with your doctor this week for a recheck if still having symptoms.  Avoid heavy lifting over 10 pounds over the next two weeks.  Low back pain is discomfort in the lower back that may be due to injuries to muscles and ligaments around the spine.  Occasionally, it may be caused by a a problem to a part of the spine called a disc.  The pain may last several days or a week;  However, most patients get completely well in 4 weeks.  Self - care:  The application of heat can help soothe the pain.  Maintaining your daily activities, including walking, is encourged, as it will help you get better faster than just staying in bed. Perform gentle stretching as discussed. Drink plenty of fluids.  Medications are also useful to help with pain control.  A commonly prescribed medication includes over the counter Tylenol; take as directed on the bottle.  Non steroidal anti inflammatory medications including Ibuprofen and naproxen;  These medications help both pain and swelling and are very useful in treating back pain.  They should be taken with food, as they can cause stomach upset, and more seriously, stomach bleeding.    Muscle relaxants:  These medications can help with muscle tightness that is a cause of lower back pain.   Most of these medications can cause drowsiness, and it is not safe to drive or use dangerous machinery while taking them.  SEEK IMMEDIATE MEDICAL ATTENTION IF: New numbness, tingling, weakness, or problem with the use of your arms or legs.  Severe back pain not relieved with medications.  Difficulty with or loss of control of your bowel or bladder control.  Increasing pain in any areas of the body (such as chest or abdominal pain).  Shortness of breath, dizziness or fainting.  Nausea (feeling sick to your stomach), vomiting, fever, or sweats.  You will need to follow up with  Your primary healthcare provider in 1-2 weeks for reassessment.

## 2017-05-31 NOTE — ED Provider Notes (Signed)
[facility] DEPT Provider Note   CSN: [id number] Arrival date & time: 05/31/17  1350     History   Chief Complaint Chief Complaint  Patient presents with  . Back Pain    HPI Nathan Buckley is a 57 y.o. male with a PMHx of HTN, HLD, MI s/p CABG, cardiomyopathy, nephrolithiasis, and pulmonary nodules, who presents to the ED with complaints of left lower back pain x 3 days.  Patient states that he bent down to pick something up and afterwards began hurting.  He describes his pain as 8/10 constant sharp left lower back pain that radiates into the lateral left thigh, worse with sitting or movement, and moderately improved with massaging his leg with a balm from [place] as well as Tylenol use.  He denies hx of similar back pain in the past.  He denies fevers, chills, CP, SOB, abd pain, N/V/D/C, hematuria, dysuria, incontinence of urine/stool, saddle anesthesia/cauda equina symptoms, hip pain, numbness, tingling, focal weakness, or any other complaints at this time.  Of note, he hasn't taken his BP meds in 4-5 months; he denies any HA, vision changes, or LE swelling, or any other complaints associated with this.    The history is provided by the patient and medical records. No language interpreter was used.  Back Pain   This is a new problem. The current episode started more than 2 days ago. The problem occurs constantly. The problem has not changed since onset.Associated with: bending over. The pain is present in the lumbar spine. Quality: sharp. The pain radiates to the left thigh. The pain is at a severity of 8/10. The pain is moderate. Exacerbated by: movement and sitting. The pain is the same all the time. Pertinent negatives include no chest pain, no fever, no numbness, no headaches, no abdominal pain, no bowel incontinence, no perianal numbness, no bladder incontinence, no dysuria, no paresthesias, no paresis, no tingling and no weakness. Treatments tried:  massaging leg with balm from [place], and tylenol. The treatment provided moderate relief.    Past Medical History:  Diagnosis Date  . Allergy   . Cardiomyopathy ([place])   . Chronic kidney disease    kidney stone  . HLD (hyperlipidemia)   . Hypertension   . Hypokalemia   . Myocardial infarction [facility])     Patient Active Problem List   Diagnosis Date Noted  . CAD (coronary artery disease) 09/18/2014  . Skin lesion of right leg 10/11/2013  . Indeterminate pulmonary nodules 02/06/2013  . S/P CABG x 4: LIMA-LAD, SVG-D1, SVG-Cx, SVG-RCA 01/01/2013  . Mitral valve regurgitation-mild 12/30/2012  . Abnormal EKG 12/28/2012  . Coronary artery disease- noted on CT 12/28/2012  . Unstable angina ([place]) 12/27/2012  . Essential hypertension 12/27/2012    Past Surgical History:  Procedure Laterality Date  . CORONARY ARTERY BYPASS GRAFT N/A 01/01/2013   Procedure: CORONARY ARTERY BYPASS GRAFTING times four using Right Greater Saphenous Vein Graft harvested endoscopically and Left Internal Mammary Artery;  Surgeon: Grace Isaac, MD;  Location: [place];  Service: Open Heart Surgery;  Laterality: N/A;  . INTRAOPERATIVE TRANSESOPHAGEAL ECHOCARDIOGRAM N/A 01/01/2013   Procedure: INTRAOPERATIVE TRANSESOPHAGEAL ECHOCARDIOGRAM;  Surgeon: Grace Isaac, MD;  Location: [place];  Service: Open Heart Surgery;  Laterality: N/A;  . LEFT HEART CATHETERIZATION WITH CORONARY ANGIOGRAM N/A 12/30/2012   Procedure: LEFT HEART CATHETERIZATION WITH CORONARY ANGIOGRAM;  Surgeon: Leonie Man, MD;  Location: [facility] CATH LAB;  Service: Cardiovascular;  Laterality: N/A;  Home Medications    Prior to Admission medications   Medication Sig Start Date End Date Taking? Authorizing Provider  acetaminophen (TYLENOL) 500 MG tablet Take 1,000 mg by mouth every 6 (six) hours as needed.    [provider]  aspirin 81 MG tablet Take 81 mg by mouth daily.    [provider]  atorvastatin (LIPITOR) 10 MG  tablet Take 1 tablet (10 mg total) by mouth daily. 01/29/15   Croitoru, Mihai, MD  furosemide (LASIX) 40 MG tablet Take 1 tablet (40 mg total) by mouth daily. 06/17/14   Croitoru, Mihai, MD  lisinopril (PRINIVIL,ZESTRIL) 2.5 MG tablet TAKE 1 TABLET BY MOUTH DAILY 01/25/15   Croitoru, Mihai, MD  lisinopril (PRINIVIL,ZESTRIL) 20 MG tablet Take 1 tablet (20 mg total) by mouth daily. 01/16/14   Croitoru, Mihai, MD  metoprolol tartrate (LOPRESSOR) 25 MG tablet TAKE 1 TABLET BY MOUTH TWICE DAILY 01/25/15   Croitoru, Dani Gobble, MD    Family History Family History  Problem Relation Age of Onset  . Colon cancer Neg Hx   . Esophageal cancer Neg Hx   . Rectal cancer Neg Hx   . Stomach cancer Neg Hx     Social History Social History   Tobacco Use  . Smoking status: Former Smoker    Last attempt to quit: 05/15/1998    Years since quitting: 19.0  . Smokeless tobacco: Never Used  Substance Use Topics  . Alcohol use: Yes    Comment: rarely- beer once a month  . Drug use: No     Allergies   Patient has no known allergies.   Review of Systems Review of Systems  Constitutional: Negative for chills and fever.  Eyes: Negative for visual disturbance.  Respiratory: Negative for shortness of breath.   Cardiovascular: Negative for chest pain and leg swelling.  Gastrointestinal: Negative for abdominal pain, bowel incontinence, constipation, diarrhea, nausea and vomiting.  Genitourinary: Negative for bladder incontinence, difficulty urinating (no incontinence), dysuria and hematuria.  Musculoskeletal: Positive for back pain and myalgias. Negative for arthralgias.  Skin: Negative for color change.  Allergic/Immunologic: Negative for immunocompromised state.  Neurological: Negative for tingling, weakness, numbness, headaches and paresthesias.  Psychiatric/Behavioral: Negative for confusion.   All other systems reviewed and are negative for acute change except as noted in the HPI.    Physical Exam Updated  Vital Signs BP (!) 190/110 (BP Location: Left Arm)   Pulse 72   Temp 98.8 F (37.1 C) (Oral)   Resp 16   SpO2 95%   Physical Exam  Constitutional: He is oriented to person, place, and time. Vital signs are normal. He appears well-developed and well-nourished.  Non-toxic appearance. No distress.  Afebrile, nontoxic, NAD, HTN noted slightly higher than prior visits  HENT:  Head: Normocephalic and atraumatic.  Mouth/Throat: Mucous membranes are normal.  Eyes: Conjunctivae and EOM are normal. Right eye exhibits no discharge. Left eye exhibits no discharge.  Neck: Normal range of motion. Neck supple.  Cardiovascular: Normal rate and intact distal pulses.  Pulmonary/Chest: Effort normal. No respiratory distress.  Abdominal: Normal appearance. He exhibits no distension. There is no tenderness. There is no rigidity, no rebound, no guarding, no CVA tenderness and no tenderness at McBurney's point.  Soft, NTND, no r/g/r, neg mcburney's, no CVA TTP   Musculoskeletal: Normal range of motion.       Lumbar back: He exhibits tenderness and spasm. He exhibits normal range of motion, no bony tenderness and no deformity.  Back:  Lumbar spine with FROM intact without spinous process TTP, no bony stepoffs or deformities, with mild L sided paraspinous muscle TTP extending into glutes and down the lateral thigh, no focal hip joint TTP, +muscle spasms in all areas of tenderness. Strength and sensation grossly intact in all extremities, negative SLR bilaterally, gait steady and nonantalgic. No overlying skin changes. Distal pulses intact.   Neurological: He is alert and oriented to person, place, and time. He has normal strength. No sensory deficit.  Skin: Skin is warm, dry and intact. No rash noted.  Psychiatric: He has a normal mood and affect.  Nursing note and vitals reviewed.    ED Treatments / Results  Labs (all labs ordered are listed, but only abnormal results are displayed) Labs Reviewed -  No data to display  EKG  EKG Interpretation None       Radiology No results found.  Procedures Procedures (including critical care time)  Medications Ordered in ED Medications - No data to display   Initial Impression / Assessment and Plan / ED Course  I have reviewed the triage vital signs and the nursing notes.  Pertinent labs & imaging results that were available during my care of the patient were reviewed by me and considered in my medical decision making (see chart for details).     57 y.o. male here with c/o L lower back pain after bending over 3 days ago. On exam, mild L paraspinous muscle TTP extending into the glutes area and into the lateral thigh, +spasms; no midline spinal tenderness. No red flag s/s of low back pain. No s/s of central cord compression or cauda equina. Lower extremities are neurovascularly intact and patient is ambulating without difficulty. No urinary complaints. Doubt need for imaging/labs, likely muscular strain.  No abdominal tenderness or CVA TTP, doubt kidney stone.   Patient was counseled on back pain precautions and told to do activity as tolerated but do not lift, push, or pull heavy objects more than 10 pounds for the next week. Patient counseled to use ice or heat on back for no longer than 15 minutes every hour.  Rx given for muscle relaxer and counseled on proper use of muscle relaxant medication. Urged patient not to drink alcohol, drive, or perform any other activities that requires focus while taking these medications. Rx for naprosyn given. Advised tylenol use as well.   Of note, HTN noted here, pt off his meds x4-5 months; asymptomatic at this time. Doubt need for emergent work up; advised starting his meds again, and DASH diet, and f/up with PCP for ongoing management of this.  Patient urged to follow-up with PCP in 1wk for recheck, or sooner if pain does not improve with treatment and rest or if pain becomes recurrent. Urged to return  with worsening severe pain, loss of bowel or bladder control, trouble walking. The patient verbalizes understanding and agrees with the plan.    Final Clinical Impressions(s) / ED Diagnoses   Final diagnoses:  Acute left-sided low back pain without sciatica  Muscle spasm of back  Essential hypertension    ED Discharge Orders        Ordered    naproxen (NAPROSYN) 500 MG tablet  2 times daily PRN     05/31/17 1449    cyclobenzaprine (FLEXERIL) 10 MG tablet  3 times daily PRN     05/31/17 [address], [place], [place] 05/31/17 1456    Lacretia Leigh, MD 06/01/17  0818  

## 2017-05-31 NOTE — ED Triage Notes (Addendum)
Patient c/o left lower back pain x3 days. Denies trauma and injury and urinary sx. Reports pain began after waking up. Denies numbness, tingling, and changes with bowel or bladder. Ambulatory.

## 2017-12-21 ENCOUNTER — Encounter: Payer: Self-pay | Admitting: Internal Medicine

## 2019-02-12 ENCOUNTER — Other Ambulatory Visit (HOSPITAL_COMMUNITY): Payer: Self-pay | Admitting: Family

## 2019-02-12 DIAGNOSIS — I169 Hypertensive crisis, unspecified: Secondary | ICD-10-CM

## 2019-02-13 ENCOUNTER — Encounter (HOSPITAL_COMMUNITY): Payer: Self-pay | Admitting: Family

## 2019-02-24 ENCOUNTER — Telehealth (HOSPITAL_COMMUNITY): Payer: Self-pay

## 2019-02-24 NOTE — Telephone Encounter (Signed)
New message    Just an FYI. We have made several attempts to contact this patient including sending a letter to schedule or reschedule their echocardiogram. We will be removing the patient from the echo WQ.   10.12.20 @ 9:48am both # are the same " the number you dial is not a working # Nathan Buckley  10.8.20 @ 12:10pm the number you dial is not a working # - Nathan Buckley   10.1.20 mail reminder letter gesial

## 2019-06-26 ENCOUNTER — Encounter: Payer: Self-pay | Admitting: Family

## 2019-09-15 ENCOUNTER — Telehealth: Payer: Self-pay | Admitting: Internal Medicine

## 2019-09-15 ENCOUNTER — Other Ambulatory Visit: Payer: Self-pay

## 2019-09-15 ENCOUNTER — Ambulatory Visit (AMBULATORY_SURGERY_CENTER): Payer: Self-pay | Admitting: *Deleted

## 2019-09-15 VITALS — Temp 97.1°F | Wt 171.0 lb

## 2019-09-15 DIAGNOSIS — Z86010 Personal history of colonic polyps: Secondary | ICD-10-CM

## 2019-09-15 MED ORDER — SUTAB 1479-225-188 MG PO TABS: 1.0000 | ORAL_TABLET | Freq: Once | ORAL | 0 refills | Status: AC

## 2019-09-15 NOTE — Progress Notes (Signed)
Pt did not bring current medication list.  Instructed to call back with medication list.  Pt reports no recent chest pain or SOB. No egg or soy allergy known to patient  No issues with past sedation with any surgeries  or procedures, no intubation problems  No diet pills per patient No home 02 use per patient  No blood thinners per patient  Pt denies issues with constipation  No A fib or A flutter  EMMI video sent to pt's e mail   Due to the COVID-19 pandemic we are asking patients to follow these guidelines. Please only bring one care partner. Please be aware that your care partner may wait in the car in the parking lot or if they feel like they will be too hot to wait in the car, they may wait in the lobby on the 4th floor. All care partners are required to wear a mask the entire time (we do not have any that we can provide them), they need to practice social distancing, and we will do a Covid check for all patient's and care partners when you arrive. Also we will check their temperature and your temperature. If the care partner waits in their car they need to stay in the parking lot the entire time and we will call them on their cell phone when the patient is ready for discharge so they can bring the car to the front of the building. Also all patient's will need to wear a mask into building.

## 2019-09-15 NOTE — Telephone Encounter (Signed)
Returned pts call.  Reviewed his medication list over the phone with him and updated in chart.

## 2019-09-24 ENCOUNTER — Encounter: Payer: Self-pay | Admitting: Internal Medicine

## 2019-09-29 ENCOUNTER — Other Ambulatory Visit: Payer: Self-pay

## 2019-09-29 ENCOUNTER — Ambulatory Visit (AMBULATORY_SURGERY_CENTER): Payer: Medicare Other | Admitting: Internal Medicine

## 2019-09-29 ENCOUNTER — Encounter: Payer: Self-pay | Admitting: Internal Medicine

## 2019-09-29 VITALS — BP 116/76 | HR 65 | Temp 97.1°F | Resp 20 | Ht 62.0 in | Wt 171.0 lb

## 2019-09-29 DIAGNOSIS — D123 Benign neoplasm of transverse colon: Secondary | ICD-10-CM | POA: Diagnosis not present

## 2019-09-29 DIAGNOSIS — Z86010 Personal history of colonic polyps: Secondary | ICD-10-CM

## 2019-09-29 DIAGNOSIS — D125 Benign neoplasm of sigmoid colon: Secondary | ICD-10-CM

## 2019-09-29 DIAGNOSIS — D120 Benign neoplasm of cecum: Secondary | ICD-10-CM

## 2019-09-29 MED ORDER — SODIUM CHLORIDE 0.9 % IV SOLN: 500.0000 mL | Freq: Once | INTRAVENOUS | Status: DC

## 2019-09-29 NOTE — Patient Instructions (Signed)
 Handouts on polyps and hemorrhoids given to you today  Await pathology results on polyps removed    YOU HAD AN ENDOSCOPIC PROCEDURE TODAY AT THE [facility]:   Refer to the procedure report that was given to you for any specific questions about what was found during the examination.  If the procedure report does not answer your questions, please call your gastroenterologist to clarify.  If you requested that your care partner not be given the details of your procedure findings, then the procedure report has been included in a sealed envelope for you to review at your convenience later.  YOU SHOULD EXPECT: Some feelings of bloating in the abdomen. Passage of more gas than usual.  Walking can help get rid of the air that was put into your GI tract during the procedure and reduce the bloating. If you had a lower endoscopy (such as a colonoscopy or flexible sigmoidoscopy) you may notice spotting of blood in your stool or on the toilet paper. If you underwent a bowel prep for your procedure, you may not have a normal bowel movement for a few days.  Please Note:  You might notice some irritation and congestion in your nose or some drainage.  This is from the oxygen used during your procedure.  There is no need for concern and it should clear up in a day or so.  SYMPTOMS TO REPORT IMMEDIATELY:  Following lower endoscopy (colonoscopy or flexible sigmoidoscopy):  Excessive amounts of blood in the stool  Significant tenderness or worsening of abdominal pains  Swelling of the abdomen that is new, acute  Fever of 100F or higher   For urgent or emergent issues, a gastroenterologist can be reached at any hour by calling [phone]. Do not use MyChart messaging for urgent concerns.    DIET:  We do recommend a small meal at first, but then you may proceed to your regular diet.  Drink plenty of fluids but you should avoid alcoholic beverages for 24 hours.  ACTIVITY:  You should plan to  take it easy for the rest of today and you should NOT DRIVE or use heavy machinery until tomorrow (because of the sedation medicines used during the test).    FOLLOW UP: Our staff will call the number listed on your records 48-72 hours following your procedure to check on you and address any questions or concerns that you may have regarding the information given to you following your procedure. If we do not reach you, we will leave a message.  We will attempt to reach you two times.  During this call, we will ask if you have developed any symptoms of COVID 19. If you develop any symptoms (ie: fever, flu-like symptoms, shortness of breath, cough etc.) before then, please call [phone].  If you test positive for Covid 19 in the 2 weeks post procedure, please call and report this information to us.    If any biopsies were taken you will be contacted by phone or by letter within the next 1-3 weeks.  Please call us at [phone] if you have not heard about the biopsies in 3 weeks.    SIGNATURES/CONFIDENTIALITY: You and/or your care partner have signed paperwork which will be entered into your electronic medical record.  These signatures attest to the fact that that the information above on your After Visit Summary has been reviewed and is understood.  Full responsibility of the confidentiality of this discharge information lies with you and/or your   and/or your care-partner. 

## 2019-09-29 NOTE — Progress Notes (Signed)
pt tolerated well. VSS. awake and to recovery. Report given to RN.  

## 2019-09-29 NOTE — Progress Notes (Signed)
Called to room to assist during endoscopic procedure.  Patient ID and intended procedure confirmed with present staff. Received instructions for my participation in the procedure from the performing physician.  

## 2019-09-29 NOTE — Op Note (Signed)
[facility] Patient Name: Nathan Buckley Procedure Date: 09/29/2019 9:25 AM MRN: [id number] Endoscopist: Jerene Bears , MD Age: 59 Referring MD:  Date of Birth: [birth year] Gender: Male Account #: [id number] Procedure:                Colonoscopy Indications:              High risk colon cancer surveillance: Personal                            history of non-advanced adenomas, Last colonoscopy:                            October 2014 Medicines:                Monitored Anesthesia Care Procedure:                Pre-Anesthesia Assessment:                           - Prior to the procedure, a History and Physical                            was performed, and patient medications and                            allergies were reviewed. The patient's tolerance of                            previous anesthesia was also reviewed. The risks                            and benefits of the procedure and the sedation                            options and risks were discussed with the patient.                            All questions were answered, and informed consent                            was obtained. Prior Anticoagulants: The patient has                            taken no previous anticoagulant or antiplatelet                            agents. ASA Grade Assessment: III - A patient with                            severe systemic disease. After reviewing the risks                            and benefits, the patient was deemed in  satisfactory condition to undergo the procedure.                           After obtaining informed consent, the colonoscope                            was passed under direct vision. Throughout the                            procedure, the patient's blood pressure, pulse, and                            oxygen saturations were monitored continuously. The                            Colonoscope was introduced through the anus  and                            advanced to the the cecum, identified by                            appendiceal orifice and ileocecal valve. The                            colonoscopy was performed without difficulty. The                            patient tolerated the procedure well. The quality                            of the bowel preparation was good. The ileocecal                            valve, appendiceal orifice, and rectum were                            photographed. Scope In: 9:36:15 AM Scope Out: 9:49:45 AM Scope Withdrawal Time: 0 hours 11 minutes 57 seconds  Total Procedure Duration: 0 hours 13 minutes 30 seconds  Findings:                 The digital rectal exam was normal.                           A 2 mm polyp was found in the cecum. The polyp was                            sessile. The polyp was removed with a cold snare.                            Resection and retrieval were complete.                           A 7 mm polyp was found in the proximal transverse  colon. The polyp was sessile. The polyp was removed                            with a cold snare. Resection and retrieval were                            complete.                           A 8 mm polyp was found in the sigmoid colon. The                            polyp was sessile. The polyp was removed with a                            cold snare. Resection and retrieval were complete.                           Internal hemorrhoids were found during                            retroflexion. The hemorrhoids were small. Complications:            No immediate complications. Estimated Blood Loss:     Estimated blood loss was minimal. Impression:               - One 2 mm polyp in the cecum, removed with a cold                            snare. Resected and retrieved.                           - One 7 mm polyp in the proximal transverse colon,                            removed with a  cold snare. Resected and retrieved.                           - One 8 mm polyp in the sigmoid colon, removed with                            a cold snare. Resected and retrieved.                           - Small internal hemorrhoids. Recommendation:           - Patient has a contact number available for                            emergencies. The signs and symptoms of potential                            delayed complications were discussed with the  patient. Return to normal activities tomorrow.                            Written discharge instructions were provided to the                            patient.                           - Resume previous diet.                           - Continue present medications.                           - Await pathology results.                           - Repeat colonoscopy is recommended for                            surveillance. The colonoscopy date will be                            determined after pathology results from today's                            exam become available for review. Jerene Bears, MD 09/29/2019 9:52:26 AM This report has been signed electronically.

## 2019-09-29 NOTE — Progress Notes (Signed)
Pt's states no medical or surgical changes since previsit or office visit.  SH Iv, KA vitals and LC temp

## 2019-10-01 ENCOUNTER — Encounter: Payer: Self-pay | Admitting: Internal Medicine

## 2019-10-01 ENCOUNTER — Telehealth: Payer: Self-pay

## 2019-10-01 NOTE — Telephone Encounter (Signed)
  Follow up Call-  Call back number 09/29/2019  Post procedure Call Back phone  # [phone]  Permission to leave phone message Yes  Some recent data might be hidden     Patient questions:  Do you have a fever, pain , or abdominal swelling? No. Pain Score  0 *  Have you tolerated food without any problems? Yes.    Have you been able to return to your normal activities? Yes.    Do you have any questions about your discharge instructions: Diet   No. Medications  No. Follow up visit  No.  Do you have questions or concerns about your Care? No.  Actions: * If pain score is 4 or above: 1. No action needed, pain <4.Have you developed a fever since your procedure? no  2.   Have you had an respiratory symptoms (SOB or cough) since your procedure? no  3.   Have you tested positive for COVID 19 since your procedure no  4.   Have you had any family members/close contacts diagnosed with the COVID 19 since your procedure?  no   If yes to any of these questions please route to Joylene John, RN and Erenest Rasher, RN

## 2021-06-21 DIAGNOSIS — N189 Chronic kidney disease, unspecified: Secondary | ICD-10-CM | POA: Diagnosis not present

## 2021-06-21 DIAGNOSIS — I10 Essential (primary) hypertension: Secondary | ICD-10-CM | POA: Diagnosis not present

## 2021-06-21 DIAGNOSIS — E1169 Type 2 diabetes mellitus with other specified complication: Secondary | ICD-10-CM | POA: Diagnosis not present

## 2021-06-21 DIAGNOSIS — I25118 Atherosclerotic heart disease of native coronary artery with other forms of angina pectoris: Secondary | ICD-10-CM | POA: Diagnosis not present

## 2021-09-20 DIAGNOSIS — E785 Hyperlipidemia, unspecified: Secondary | ICD-10-CM | POA: Diagnosis not present

## 2021-09-20 DIAGNOSIS — I10 Essential (primary) hypertension: Secondary | ICD-10-CM | POA: Diagnosis not present

## 2021-09-20 DIAGNOSIS — E119 Type 2 diabetes mellitus without complications: Secondary | ICD-10-CM | POA: Diagnosis not present

## 2021-09-20 DIAGNOSIS — I25118 Atherosclerotic heart disease of native coronary artery with other forms of angina pectoris: Secondary | ICD-10-CM | POA: Diagnosis not present

## 2021-12-26 DIAGNOSIS — E785 Hyperlipidemia, unspecified: Secondary | ICD-10-CM | POA: Diagnosis not present

## 2021-12-26 DIAGNOSIS — I25118 Atherosclerotic heart disease of native coronary artery with other forms of angina pectoris: Secondary | ICD-10-CM | POA: Diagnosis not present

## 2021-12-26 DIAGNOSIS — E119 Type 2 diabetes mellitus without complications: Secondary | ICD-10-CM | POA: Diagnosis not present

## 2021-12-26 DIAGNOSIS — I10 Essential (primary) hypertension: Secondary | ICD-10-CM | POA: Diagnosis not present

## 2022-03-29 DIAGNOSIS — E785 Hyperlipidemia, unspecified: Secondary | ICD-10-CM | POA: Diagnosis not present

## 2022-03-29 DIAGNOSIS — E119 Type 2 diabetes mellitus without complications: Secondary | ICD-10-CM | POA: Diagnosis not present

## 2022-03-29 DIAGNOSIS — I25118 Atherosclerotic heart disease of native coronary artery with other forms of angina pectoris: Secondary | ICD-10-CM | POA: Diagnosis not present

## 2022-03-29 DIAGNOSIS — I10 Essential (primary) hypertension: Secondary | ICD-10-CM | POA: Diagnosis not present

## 2022-06-28 DIAGNOSIS — E785 Hyperlipidemia, unspecified: Secondary | ICD-10-CM | POA: Diagnosis not present

## 2022-06-28 DIAGNOSIS — I10 Essential (primary) hypertension: Secondary | ICD-10-CM | POA: Diagnosis not present

## 2022-06-28 DIAGNOSIS — E119 Type 2 diabetes mellitus without complications: Secondary | ICD-10-CM | POA: Diagnosis not present

## 2022-06-28 DIAGNOSIS — I25118 Atherosclerotic heart disease of native coronary artery with other forms of angina pectoris: Secondary | ICD-10-CM | POA: Diagnosis not present

## 2022-10-24 ENCOUNTER — Encounter: Payer: Self-pay | Admitting: Internal Medicine

## 2023-03-19 DIAGNOSIS — E1169 Type 2 diabetes mellitus with other specified complication: Secondary | ICD-10-CM | POA: Diagnosis not present

## 2023-03-19 DIAGNOSIS — I10 Essential (primary) hypertension: Secondary | ICD-10-CM | POA: Diagnosis not present

## 2023-03-19 DIAGNOSIS — E782 Mixed hyperlipidemia: Secondary | ICD-10-CM | POA: Diagnosis not present

## 2023-03-19 DIAGNOSIS — I2510 Atherosclerotic heart disease of native coronary artery without angina pectoris: Secondary | ICD-10-CM | POA: Diagnosis not present
